# Patient Record
Sex: Female | Born: 1965 | Race: Black or African American | Hispanic: No | Marital: Married | Smoking: Former smoker
Health system: Southern US, Community
[De-identification: ages and names within clinical notes are randomized; demographics above are authoritative.]

## PROBLEM LIST (undated history)

## (undated) DIAGNOSIS — E079 Disorder of thyroid, unspecified: Secondary | ICD-10-CM

## (undated) DIAGNOSIS — B029 Zoster without complications: Secondary | ICD-10-CM

## (undated) DIAGNOSIS — T7840XA Allergy, unspecified, initial encounter: Secondary | ICD-10-CM

## (undated) DIAGNOSIS — E785 Hyperlipidemia, unspecified: Secondary | ICD-10-CM

## (undated) DIAGNOSIS — I10 Essential (primary) hypertension: Secondary | ICD-10-CM

## (undated) DIAGNOSIS — E28319 Asymptomatic premature menopause: Secondary | ICD-10-CM

## (undated) HISTORY — DX: Zoster without complications: B02.9

## (undated) HISTORY — DX: Essential (primary) hypertension: I10

## (undated) HISTORY — DX: Allergy, unspecified, initial encounter: T78.40XA

## (undated) HISTORY — DX: Hyperlipidemia, unspecified: E78.5

## (undated) HISTORY — DX: Asymptomatic premature menopause: E28.319

## (undated) HISTORY — PX: OOPHORECTOMY: SHX86

---

## 2008-12-29 HISTORY — PX: ABDOMINAL HYSTERECTOMY: SHX81

## 2011-04-03 ENCOUNTER — Emergency Department (HOSPITAL_COMMUNITY)
Admission: EM | Admit: 2011-04-03 | Discharge: 2011-04-03 | Disposition: A | Discharge status: Home / Self Care | Payer: No Typology Code available for payment source | Attending: Emergency Medicine | Admitting: Emergency Medicine

## 2011-04-03 DIAGNOSIS — E039 Hypothyroidism, unspecified: Secondary | ICD-10-CM | POA: Insufficient documentation

## 2011-04-03 DIAGNOSIS — M25519 Pain in unspecified shoulder: Secondary | ICD-10-CM | POA: Insufficient documentation

## 2011-04-03 DIAGNOSIS — M542 Cervicalgia: Secondary | ICD-10-CM | POA: Insufficient documentation

## 2011-04-03 DIAGNOSIS — S139XXA Sprain of joints and ligaments of unspecified parts of neck, initial encounter: Secondary | ICD-10-CM | POA: Insufficient documentation

## 2012-04-15 ENCOUNTER — Encounter: Payer: Self-pay | Admitting: Internal Medicine

## 2012-04-15 ENCOUNTER — Ambulatory Visit (INDEPENDENT_AMBULATORY_CARE_PROVIDER_SITE_OTHER): Payer: 59 | Admitting: Internal Medicine

## 2012-04-15 DIAGNOSIS — M549 Dorsalgia, unspecified: Secondary | ICD-10-CM | POA: Insufficient documentation

## 2012-04-15 DIAGNOSIS — Z0000 Encounter for general adult medical examination without abnormal findings: Secondary | ICD-10-CM

## 2012-04-15 MED ORDER — HYDROCHLOROTHIAZIDE 25 MG PO TABS: 25.0000 mg | ORAL_TABLET | Freq: Every day | ORAL | Status: DC

## 2012-04-15 MED ORDER — METHOCARBAMOL 500 MG PO TABS: 500.0000 mg | ORAL_TABLET | Freq: Two times a day (BID) | ORAL | Status: AC | PRN

## 2012-04-15 NOTE — Patient Instructions (Signed)
Please get the following labs: FLP, CMP, TSH, CBC---- Dx V70 ----------------------- for back pain, Stretching twice a day Robaxin twice a day, only at bedtime if it makes you sleepy. Local heat. ----------------------- Check the  blood pressure 2 or 3 times a month, be sure it is less than 140/85. If it is consistently higher, let me know

## 2012-04-15 NOTE — Assessment & Plan Note (Signed)
See  review of systems, recommend stretching, warm compresses on a low dose of Robaxin. Continue with over-the-counter ibuprofen
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
## 2012-04-15 NOTE — Assessment & Plan Note (Addendum)
Td ~2008 Reports a colonoscopy in a few years ago, due to bleeding, she was told has hemorrhoids. Last mammogram per patient ~ 1 2013, last PAP ~ 3 years ago Labs, likes to get them at work, see instructions Diet and exercise discussed Refer to gynecology

## 2012-04-16 ENCOUNTER — Encounter: Payer: Self-pay | Admitting: Internal Medicine

## 2012-05-17 ENCOUNTER — Other Ambulatory Visit: Payer: Self-pay | Admitting: Internal Medicine

## 2012-05-17 MED ORDER — LEVOTHYROXINE SODIUM 125 MCG PO TABS: 125.0000 ug | ORAL_TABLET | Freq: Every day | ORAL | Status: DC

## 2012-05-17 NOTE — Telephone Encounter (Signed)
Refilled levothyroxine. Spoke with pharmacy & pt still has refills of hydrochlorithazide on file.

## 2012-05-27 ENCOUNTER — Telehealth: Payer: Self-pay | Admitting: *Deleted

## 2012-05-27 DIAGNOSIS — N951 Menopausal and female climacteric states: Secondary | ICD-10-CM

## 2012-05-27 NOTE — Telephone Encounter (Signed)
Done

## 2012-06-08 ENCOUNTER — Telehealth: Payer: Self-pay | Admitting: Internal Medicine

## 2012-06-08 NOTE — Telephone Encounter (Signed)
Thank you :)

## 2012-06-08 NOTE — Telephone Encounter (Signed)
Caller: Connie Gentry; PCP: Willow Ora; CB#: [phone]; ; ; Call regarding Dizziness;  onset 06/06/12 but has continued to currently.  States BP 113/67; no recent changes in medications.  Per protocol, emergent symptoms denied; advised appt within 24 hours.  Appt sched 0915 06/09/12 with Dr. Beverely Low, as patient wants to have fasting blood work drawn while in office (per follow up Dr. Drue Novel.)

## 2012-06-09 ENCOUNTER — Ambulatory Visit (INDEPENDENT_AMBULATORY_CARE_PROVIDER_SITE_OTHER): Payer: 59 | Admitting: Family Medicine

## 2012-06-09 ENCOUNTER — Encounter: Payer: Self-pay | Admitting: Family Medicine

## 2012-06-09 VITALS — BP 101/70 | HR 67 | Temp 99.1°F | Ht 66.0 in | Wt 220.8 lb

## 2012-06-09 DIAGNOSIS — E039 Hypothyroidism, unspecified: Secondary | ICD-10-CM

## 2012-06-09 DIAGNOSIS — N951 Menopausal and female climacteric states: Secondary | ICD-10-CM

## 2012-06-09 DIAGNOSIS — R42 Dizziness and giddiness: Secondary | ICD-10-CM

## 2012-06-09 DIAGNOSIS — I10 Essential (primary) hypertension: Secondary | ICD-10-CM | POA: Insufficient documentation

## 2012-06-09 DIAGNOSIS — E785 Hyperlipidemia, unspecified: Secondary | ICD-10-CM | POA: Insufficient documentation

## 2012-06-09 LAB — CBC WITH DIFFERENTIAL/PLATELET
Basophils Relative: 0.4 % (ref 0.0–3.0)
Eosinophils Relative: 2.9 % (ref 0.0–5.0)
Lymphocytes Relative: 32.5 % (ref 12.0–46.0)
Monocytes Relative: 6 % (ref 3.0–12.0)
Neutrophils Relative %: 58.2 % (ref 43.0–77.0)
RBC: 5.17 Mil/uL — ABNORMAL HIGH (ref 3.87–5.11)
WBC: 4.3 10*3/uL — ABNORMAL LOW (ref 4.5–10.5)

## 2012-06-09 LAB — LDL CHOLESTEROL, DIRECT: Direct LDL: 137.2 mg/dL

## 2012-06-09 LAB — LIPID PANEL
HDL: 60.8 mg/dL (ref >39.00)
VLDL: 13.2 mg/dL (ref 0.0–40.0)

## 2012-06-09 LAB — BASIC METABOLIC PANEL
BUN: 13 mg/dL (ref 6–23)
Chloride: 105 mEq/L (ref 96–112)
Glucose, Bld: 87 mg/dL (ref 70–99)
Potassium: 3.6 mEq/L (ref 3.5–5.1)
Sodium: 139 mEq/L (ref 135–145)

## 2012-06-09 LAB — FOLLICLE STIMULATING HORMONE: FSH: 35.4 m[IU]/mL

## 2012-06-09 LAB — HEPATIC FUNCTION PANEL
ALT: 14 U/L (ref 0–35)
AST: 17 U/L (ref 0–37)
Albumin: 3.5 g/dL (ref 3.5–5.2)
Alkaline Phosphatase: 68 U/L (ref 39–117)

## 2012-06-09 NOTE — Assessment & Plan Note (Signed)
New.  Suspect this is due to hypotension.  Stop HCTZ.  Check labs to r/o thyroid, anemia, electrolyte imbalance.  Reviewed supportive care and red flags that should prompt return.  Pt expressed understanding and is in agreement w/ plan.

## 2012-06-09 NOTE — Assessment & Plan Note (Signed)
New to provider, chronic for pt.  Check labs to determine if dose is correct or if this is contributing to dizziness.

## 2012-06-09 NOTE — Progress Notes (Signed)
  Subjective:    Patient ID: Connie Gentry, female    DOB: 11-Jun-1966, 46 y.o.   MRN: [id number]  HPI Dizziness- sxs started yesterday.  Not related to position changes but more noticeable when active.  Drinking fair amount of water.  Pt has recently changed diet, improved lifestyle.  BP has been running low- even prior to taking HCTZ in the AM.  Has hx of low thyroid.  On synthroid.  Denies N/V, visual changes.  No CP, SOB.  Denies fatigue.   Review of Systems For ROS see HPI     Objective:   Physical Exam  Vitals reviewed. Constitutional: She is oriented to person, place, and time. She appears well-developed and well-nourished. No distress.  HENT:  Head: Normocephalic and atraumatic.  Eyes: Conjunctivae and EOM are normal. Pupils are equal, round, and reactive to light.  Neck: Normal range of motion. Neck supple. No thyromegaly present.  Cardiovascular: Normal rate, regular rhythm, normal heart sounds and intact distal pulses.   No murmur heard. Pulmonary/Chest: Effort normal and breath sounds normal. No respiratory distress.  Abdominal: Soft. She exhibits no distension. There is no tenderness.  Musculoskeletal: She exhibits no edema.  Lymphadenopathy:    She has no cervical adenopathy.  Neurological: She is alert and oriented to person, place, and time.  Skin: Skin is warm and dry.  Psychiatric: She has a normal mood and affect. Her behavior is normal.          Assessment & Plan:

## 2012-06-09 NOTE — Assessment & Plan Note (Signed)
Due for labs, did not have them drawn at time of CPE.

## 2012-06-09 NOTE — Patient Instructions (Addendum)
Follow up in 1 month to recheck BP We'll notify you of your lab results STOP the HCTZ Continue to drink plenty of fluids Change positions slowly Keep up the good work on healthy diet and regular activity Call with any questions or concerns CONGRATS!

## 2012-06-09 NOTE — Assessment & Plan Note (Signed)
Improved.  Stop HCTZ and monitor.

## 2012-06-10 ENCOUNTER — Encounter: Payer: Self-pay | Admitting: *Deleted

## 2012-07-06 ENCOUNTER — Encounter: Payer: Self-pay | Admitting: Internal Medicine

## 2012-07-06 ENCOUNTER — Ambulatory Visit (INDEPENDENT_AMBULATORY_CARE_PROVIDER_SITE_OTHER): Payer: 59 | Admitting: Internal Medicine

## 2012-07-06 VITALS — BP 132/86 | HR 73 | Temp 98.9°F | Wt 223.0 lb

## 2012-07-06 DIAGNOSIS — L853 Xerosis cutis: Secondary | ICD-10-CM | POA: Insufficient documentation

## 2012-07-06 DIAGNOSIS — I10 Essential (primary) hypertension: Secondary | ICD-10-CM

## 2012-07-06 DIAGNOSIS — F411 Generalized anxiety disorder: Secondary | ICD-10-CM

## 2012-07-06 DIAGNOSIS — F419 Anxiety disorder, unspecified: Secondary | ICD-10-CM | POA: Insufficient documentation

## 2012-07-06 DIAGNOSIS — R238 Other skin changes: Secondary | ICD-10-CM

## 2012-07-06 MED ORDER — TRIAMCINOLONE 0.1 % CREAM:EUCERIN CREAM 1:1: 1.0000 "application " | TOPICAL_CREAM | Freq: Two times a day (BID) | CUTANEOUS | Status: DC | PRN

## 2012-07-06 MED ORDER — ALPRAZOLAM 0.25 MG PO TABS: 0.2500 mg | ORAL_TABLET | Freq: Four times a day (QID) | ORAL | Status: AC | PRN

## 2012-07-06 NOTE — Progress Notes (Signed)
  Subjective:    Patient ID: Connie Gentry, female    DOB: [birth year], 46 y.o.   MRN: [id number]  HPI Here for evaluation of anxiety. Started a new job several months ago, in the last 2 or 3 weeks she has experienced more stress and anxiety, occasionally she panics. She had the same symptoms before, years ago, see assessment and plan  Also, last month was seen here with dizziness felt to be due to to decrease BP, likely from a much better diet habit. HCTZ was discontinued, in the last 2 weeks her BP has been increased gradually because stress and some "stress eating".  Last week at one point her BP was 156/99.   Past medical history Mild hypertension, diagnosed ~2011 Hypothyroidism, history of Graves' disease, status radioactive  iodine 2003. 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 Sleeping okay but not as well as before.     Objective:   Physical Exam  General -- alert, well-developed, and overweight appearing.  Neurologic-- alert & oriented X3 and strength normal in all extremities. Psych-- Cognition and judgment appear intact. Alert and cooperative with normal attention span and concentration.  Slightly anxious and emotional.      Assessment & Plan:

## 2012-07-06 NOTE — Assessment & Plan Note (Addendum)
Dry itchy skin, uses triamcinolone/eucerin cream as needed. Request a refill---> done

## 2012-07-06 NOTE — Patient Instructions (Addendum)
Check the  blood pressure 2 or 3 times a week, be sure it is between 110/60 and 140/85. If it is consistently higher or lower, let me know  

## 2012-07-06 NOTE — Assessment & Plan Note (Addendum)
Patient presents with anxiety and occasionally a panic feeling. Symptoms are most likely job-stress related. In the past she experienced similar symptoms, she tried multiple SSRIs including Celexa, Paxil and Zoloft. She could not function well while taking them. Eventually was treated with a low dose of alprazolam with very good results. We discussed different modalities of treatment including counseling, exercise and medication. Does not like to try SSRIs again, declined clonazepam as she has taken alprazolam with excellent results before. Plan: Low dose of alprazolam, counseling, reassess in 3 months. Counseled today at the office as wel  Today , I spent more than 25  min with the patient, >50% of the time counseling

## 2012-07-06 NOTE — Assessment & Plan Note (Signed)
After her lifestyle improved, BP become low and HCTZ was discontinued last month. Here lately her BP is slightly increasing. Today is 132/86. Plan: Continue with healthier lifestyle, treat anxiety, see instructions. Consider restart medication.

## 2012-11-16 ENCOUNTER — Other Ambulatory Visit: Payer: Self-pay | Admitting: Internal Medicine

## 2012-11-16 DIAGNOSIS — Z1231 Encounter for screening mammogram for malignant neoplasm of breast: Secondary | ICD-10-CM

## 2012-12-27 ENCOUNTER — Ambulatory Visit: Payer: 59

## 2013-03-16 ENCOUNTER — Other Ambulatory Visit: Payer: Self-pay

## 2013-04-13 ENCOUNTER — Ambulatory Visit
Admission: RE | Admit: 2013-04-13 | Discharge: 2013-04-13 | Disposition: A | Discharge status: Home / Self Care | Payer: 59 | Source: Ambulatory Visit

## 2013-04-13 DIAGNOSIS — Z1231 Encounter for screening mammogram for malignant neoplasm of breast: Secondary | ICD-10-CM

## 2013-04-17 ENCOUNTER — Emergency Department (HOSPITAL_COMMUNITY)
Admission: EM | Admit: 2013-04-17 | Discharge: 2013-04-18 | Disposition: A | Discharge status: Home / Self Care | Payer: 59 | Attending: Emergency Medicine | Admitting: Emergency Medicine

## 2013-04-17 ENCOUNTER — Emergency Department (HOSPITAL_COMMUNITY): Payer: 59

## 2013-04-17 DIAGNOSIS — Z87891 Personal history of nicotine dependence: Secondary | ICD-10-CM | POA: Insufficient documentation

## 2013-04-17 DIAGNOSIS — K2990 Gastroduodenitis, unspecified, without bleeding: Secondary | ICD-10-CM | POA: Insufficient documentation

## 2013-04-17 DIAGNOSIS — Z79899 Other long term (current) drug therapy: Secondary | ICD-10-CM | POA: Insufficient documentation

## 2013-04-17 DIAGNOSIS — K2970 Gastritis, unspecified, without bleeding: Secondary | ICD-10-CM | POA: Insufficient documentation

## 2013-04-17 DIAGNOSIS — R110 Nausea: Secondary | ICD-10-CM | POA: Insufficient documentation

## 2013-04-17 DIAGNOSIS — E785 Hyperlipidemia, unspecified: Secondary | ICD-10-CM | POA: Insufficient documentation

## 2013-04-17 DIAGNOSIS — Z90710 Acquired absence of both cervix and uterus: Secondary | ICD-10-CM | POA: Insufficient documentation

## 2013-04-17 DIAGNOSIS — R10816 Epigastric abdominal tenderness: Secondary | ICD-10-CM | POA: Insufficient documentation

## 2013-04-17 DIAGNOSIS — Z8619 Personal history of other infectious and parasitic diseases: Secondary | ICD-10-CM | POA: Insufficient documentation

## 2013-04-17 DIAGNOSIS — I10 Essential (primary) hypertension: Secondary | ICD-10-CM | POA: Insufficient documentation

## 2013-04-17 DIAGNOSIS — K219 Gastro-esophageal reflux disease without esophagitis: Secondary | ICD-10-CM | POA: Insufficient documentation

## 2013-04-17 LAB — CBC
HCT: 36.7 % (ref 36.0–46.0)
Hemoglobin: 12.1 g/dL (ref 12.0–15.0)
MCH: 21.9 pg — ABNORMAL LOW (ref 26.0–34.0)
MCHC: 33 g/dL (ref 30.0–36.0)
MCV: 66.4 fL — ABNORMAL LOW (ref 78.0–100.0)
Platelets: 225 10*3/uL (ref 150–400)
RBC: 5.53 MIL/uL — ABNORMAL HIGH (ref 3.87–5.11)
RDW: 15.6 % — ABNORMAL HIGH (ref 11.5–15.5)
WBC: 6.6 10*3/uL (ref 4.0–10.5)

## 2013-04-17 LAB — BASIC METABOLIC PANEL
BUN: 14 mg/dL (ref 6–23)
Chloride: 102 mEq/L (ref 96–112)
GFR calc Af Amer: 81 mL/min — ABNORMAL LOW (ref >90)
Potassium: 3.4 mEq/L — ABNORMAL LOW (ref 3.5–5.1)

## 2013-04-17 LAB — BASIC METABOLIC PANEL WITH GFR
CO2: 26 meq/L (ref 19–32)
Calcium: 9.6 mg/dL (ref 8.4–10.5)
Creatinine, Ser: 0.95 mg/dL (ref 0.50–1.10)
GFR calc non Af Amer: 70 mL/min — ABNORMAL LOW (ref >90)
Glucose, Bld: 93 mg/dL (ref 70–99)
Sodium: 137 meq/L (ref 135–145)

## 2013-04-17 LAB — POCT I-STAT TROPONIN I: Troponin i, poc: 0 ng/mL (ref 0.00–0.08)

## 2013-04-17 MED ORDER — ASPIRIN 81 MG PO CHEW
324.0000 mg | CHEWABLE_TABLET | Freq: Once | ORAL | Status: AC
  Administered 2013-04-17: 324 mg via ORAL
  Filled 2013-04-17: qty 1
  Filled 2013-04-17: qty 3

## 2013-04-17 MED ORDER — PANTOPRAZOLE SODIUM 40 MG PO TBEC
40.0000 mg | DELAYED_RELEASE_TABLET | Freq: Once | ORAL | Status: AC
  Administered 2013-04-17: 40 mg via ORAL
  Filled 2013-04-17: qty 1

## 2013-04-17 MED ORDER — GI COCKTAIL ~~LOC~~
30.0000 mL | Freq: Once | ORAL | Status: AC
  Administered 2013-04-17: 30 mL via ORAL
  Filled 2013-04-17: qty 30

## 2013-04-17 NOTE — ED Notes (Addendum)
Pt to the ED with c/o chest pain. Pt states tonight she had a sudden onset of sharp sternal chest pain, radiating to back. Pt denies shortness of breath, n/v. Pt took one 325 asa and a Rolaid x1. Pt received some relief, now states pain 1/10. Pt denies hx of the same. Pt is A&Ox4, respirations are equal and unlabored, skin warm and dry, husband at bedside

## 2013-04-17 NOTE — ED Provider Notes (Signed)
History     CSN: [id number]  Arrival date & time 04/17/13  2155   First MD Initiated Contact with Patient 04/17/13 2243      Chief Complaint  Patient presents with  . Chest Pain    (Consider location/radiation/quality/duration/timing/severity/associated sxs/prior treatment) HPI History provided by pt.   Pt had acute onset constant, sharp, non-radiating, mid-sternal CP at approx 9:30pm while sitting down.  Has mild discomfort currently but severe pain began to improve after 20-30 minutes.  No immediate relief w/ aspirin or rolaids.  No associated symptoms, w/ exception of intermittent nausea over past few days that seem to coincide w/ her menopausal hot flashes. Has never had pain like this before.  No recent heavy lifting or trauma to chest.  No PMH, does not smoke cigarettes and no immediate FH of MI.   Past Medical History  Diagnosis Date  . Chicken pox   . Allergy   . Hypertension   . Hyperlipidemia     Past Surgical History  Procedure Laterality Date  . Abdominal hysterectomy  2010    partial    Family History  Problem Relation Age of Onset  . Hypertension Mother   . Hypertension Father   . Arthritis Maternal Grandmother   . Heart disease Maternal Grandmother   . Arthritis Maternal Grandfather   . Heart disease Maternal Grandfather   . Arthritis Paternal Grandmother   . Heart disease Paternal Grandmother   . Arthritis Paternal Grandfather   . Heart disease Paternal Grandfather   . Diabetes Other     History  Substance Use Topics  . Smoking status: Former Games developer  . Smokeless tobacco: Never Used  . Alcohol Use:     OB History   Grav Para Term Preterm Abortions TAB SAB Ect Mult Living                  Review of Systems  All other systems reviewed and are negative.    Allergies  Cephalexin and Vicodin  Home Medications   Current Outpatient Rx  Name  Route  Sig  Dispense  Refill  . cholecalciferol (VITAMIN D) 1000 UNITS tablet   Oral   Take  1,000 Units by mouth daily.         . hydrochlorothiazide (MICROZIDE) 12.5 MG capsule   Oral   Take 12.5 mg by mouth daily.         Marland Kitchen levothyroxine (SYNTHROID, LEVOTHROID) 125 MCG tablet   Oral   Take 1 tablet (125 mcg total) by mouth daily.   90 tablet   3   . methocarbamol (ROBAXIN) 500 MG tablet   Oral   Take 500 mg by mouth at bedtime as needed and may repeat dose one time if needed. Back spasms         . Triamcinolone Acetonide (TRIAMCINOLONE 0.1 % CREAM : EUCERIN) CREA   Topical   Apply 1 application topically 2 (two) times daily as needed.   1 each   3   . vitamin E 400 UNIT capsule   Oral   Take 400 Units by mouth daily.           BP 137/84  Pulse 69  Temp(Src) 97.9 F (36.6 C) (Oral)  Resp 18  Ht 5\' 6"  (1.676 m)  Wt 216 lb (97.977 kg)  BMI 34.88 kg/m2  SpO2 100%  LMP 07/29/2012  Physical Exam  Nursing note and vitals reviewed. Constitutional: She is oriented to person, place, and time. She appears  well-developed and well-nourished. No distress.  HENT:  Head: Normocephalic and atraumatic.  Eyes:  Normal appearance  Neck: Normal range of motion.  Cardiovascular: Normal rate, regular rhythm and intact distal pulses.   Pulmonary/Chest: Effort normal and breath sounds normal. No respiratory distress. She exhibits no tenderness.  No pleuritic pain reported  Abdominal: Soft. Bowel sounds are normal. She exhibits no distension. There is no guarding.  Mid epigastric tenderness  Musculoskeletal: Normal range of motion.  No peripheral edema or calf tenderness  Neurological: She is alert and oriented to person, place, and time.  Skin: Skin is warm and dry. No rash noted.  Psychiatric: She has a normal mood and affect. Her behavior is normal.    ED Course  Procedures (including critical care time)   Date: 04/18/2013  Rate: 58  Rhythm: sinus bradycardia  QRS Axis: normal  Intervals: normal  ST/T Wave abnormalities: normal  Conduction  Disutrbances:none  Narrative Interpretation:   Old EKG Reviewed: none available   Labs Reviewed  CBC - Abnormal; Notable for the following:    RBC 5.53 (*)    MCV 66.4 (*)    MCH 21.9 (*)    RDW 15.6 (*)    All other components within normal limits  BASIC METABOLIC PANEL   Dg Chest 2 View  04/17/2013  *RADIOLOGY REPORT*  Clinical Data: Mid chest pain.  Nonsmoker.  CHEST - 2 VIEW  Comparison: None.  Findings: Midline trachea.  Mild convex right thoracic spine curvature. Normal heart size and mediastinal contours. No pleural effusion or pneumothorax.  Clear lungs.  IMPRESSION: No acute cardiopulmonary disease.   Original Report Authenticated By: Jeronimo Greaves, M.D.      1. Gastritis   2. Acid reflux       MDM  Healthy 47yo F presents w/ acute onset substernal CP at rest this evening.  Doubt ACS; pain atypical and more consistent w/ indigestion, epigastric tenderness on exam and relief w/ GI cocktail, TIMI 0, and EKG non-ischemic.  Recommended prilosec x 2 weeks, prescribed zofran for intermittent nausea and referred back to PCP.  Return precautions discussed.        Otilio Miu, PA-C 04/18/13 [phone]

## 2013-04-18 ENCOUNTER — Encounter: Payer: 59 | Admitting: Internal Medicine

## 2013-04-18 ENCOUNTER — Encounter: Payer: Self-pay | Admitting: *Deleted

## 2013-04-18 DIAGNOSIS — Z0289 Encounter for other administrative examinations: Secondary | ICD-10-CM

## 2013-04-18 MED ORDER — ONDANSETRON HCL 8 MG PO TABS: 8.0000 mg | ORAL_TABLET | Freq: Three times a day (TID) | ORAL | Status: DC | PRN

## 2013-04-18 NOTE — ED Provider Notes (Signed)
Medical screening examination/treatment/procedure(s) were performed by non-physician practitioner and as supervising physician I was immediately available for consultation/collaboration.   Gavin Pound. Oletta Lamas, MD 04/18/13 [phone]

## 2013-04-20 ENCOUNTER — Encounter: Payer: Self-pay | Admitting: Family Medicine

## 2013-04-20 ENCOUNTER — Ambulatory Visit (INDEPENDENT_AMBULATORY_CARE_PROVIDER_SITE_OTHER): Payer: 59 | Admitting: Family Medicine

## 2013-04-20 ENCOUNTER — Ambulatory Visit: Payer: 59 | Admitting: Internal Medicine

## 2013-04-20 VITALS — BP 116/80 | HR 80 | Temp 99.2°F | Wt 219.2 lb

## 2013-04-20 DIAGNOSIS — K219 Gastro-esophageal reflux disease without esophagitis: Secondary | ICD-10-CM

## 2013-04-20 LAB — BASIC METABOLIC PANEL
GFR: 88.08 mL/min (ref >60.00)
Potassium: 3.2 mEq/L — ABNORMAL LOW (ref 3.5–5.1)
Sodium: 140 mEq/L (ref 135–145)

## 2013-04-20 LAB — CBC WITH DIFFERENTIAL/PLATELET
Eosinophils Relative: 4.2 % (ref 0.0–5.0)
HCT: 36.8 % (ref 36.0–46.0)
Hemoglobin: 11.7 g/dL — ABNORMAL LOW (ref 12.0–15.0)
Lymphs Abs: 1.7 10*3/uL (ref 0.7–4.0)
Monocytes Relative: 5.6 % (ref 3.0–12.0)
Neutro Abs: 3.2 10*3/uL (ref 1.4–7.7)
RBC: 5.26 Mil/uL — ABNORMAL HIGH (ref 3.87–5.11)
WBC: 5.4 10*3/uL (ref 4.5–10.5)

## 2013-04-20 LAB — HEPATIC FUNCTION PANEL
ALT: 20 U/L (ref 0–35)
Alkaline Phosphatase: 89 U/L (ref 39–117)
Bilirubin, Direct: 0 mg/dL (ref 0.0–0.3)
Total Protein: 7 g/dL (ref 6.0–8.3)

## 2013-04-20 LAB — POCT URINALYSIS DIPSTICK
Blood, UA: NEGATIVE
Glucose, UA: NEGATIVE
Ketones, UA: NEGATIVE
Spec Grav, UA: 1.01
Urobilinogen, UA: 0.2

## 2013-04-20 MED ORDER — DEXLANSOPRAZOLE 60 MG PO CPDR: 60.0000 mg | DELAYED_RELEASE_CAPSULE | Freq: Every day | ORAL | Status: DC

## 2013-04-20 NOTE — Patient Instructions (Signed)
Diet for Gastroesophageal Reflux Disease, Adult  Reflux (acid reflux) is when acid from your stomach flows up into the esophagus. When acid comes in contact with the esophagus, the acid causes irritation and soreness (inflammation) in the esophagus. When reflux happens often or so severely that it causes damage to the esophagus, it is called gastroesophageal reflux disease (GERD). Nutrition therapy can help ease the discomfort of GERD.  FOODS OR DRINKS TO AVOID OR LIMIT   Smoking or chewing tobacco. Nicotine is one of the most potent stimulants to acid production in the gastrointestinal tract.   Caffeinated and decaffeinated coffee and black tea.   Regular or low-calorie carbonated beverages or energy drinks (caffeine-free carbonated beverages are allowed).    Strong spices, such as black pepper, white pepper, red pepper, cayenne, curry powder, and chili powder.   Peppermint or spearmint.   Chocolate.   High-fat foods, including meats and fried foods. Extra added fats including oils, butter, salad dressings, and nuts. Limit these to less than 8 tsp per day.   Fruits and vegetables if they are not tolerated, such as citrus fruits or tomatoes.   Alcohol.   Any food that seems to aggravate your condition.  If you have questions regarding your diet, call your caregiver or a registered dietitian.  OTHER THINGS THAT MAY HELP GERD INCLUDE:    Eating your meals slowly, in a relaxed setting.   Eating 5 to 6 small meals per day instead of 3 large meals.   Eliminating food for a period of time if it causes distress.   Not lying down until 3 hours after eating a meal.   Keeping the head of your bed raised 6 to 9 inches (15 to 23 cm) by using a foam wedge or blocks under the legs of the bed. Lying flat may make symptoms worse.   Being physically active. Weight loss may be helpful in reducing reflux in overweight or obese adults.   Wear loose fitting clothing  EXAMPLE MEAL PLAN  This meal plan is approximately  2,000 calories based on ChooseMyPlate.gov meal planning guidelines.  Breakfast    cup cooked oatmeal.   1 cup strawberries.   1 cup low-fat milk.   1 oz almonds.  Snack   1 cup cucumber slices.   6 oz yogurt (made from low-fat or fat-free milk).  Lunch   2 slice whole-wheat bread.   2 oz sliced turkey.   2 tsp mayonnaise.   1 cup blueberries.   1 cup snap peas.  Snack   6 whole-wheat crackers.   1 oz string cheese.  Dinner    cup brown rice.   1 cup mixed veggies.   1 tsp olive oil.   3 oz grilled fish.  Document Released: 12/15/2005 Document Revised: 03/08/2012 Document Reviewed: 10/31/2011  [employer] Patient Information 2013 [employer].

## 2013-04-20 NOTE — Progress Notes (Signed)
  Subjective:     Connie Gentry is an 47 y.o. female who presents for evaluation of heartburn. This has been associated with belching, chest pain, heartburn and midespigastric pain. She denies abdominal bloating, belching and eructation, bilious reflux, choking on food, difficulty swallowing, dysphagia, fullness after meals, hematemesis, laryngitis, need to clear throat frequently, nocturnal burning, odynophagia, regurgitation of undigested food, shortness of breath, symptoms primarily relate to meals, and lying down after meals, unexpected weight loss, upper abdominal discomfort and waterbrash. Symptoms have been present for several days. She denies dysphagia. She has not lost weight. She denies melena, hematochezia, hematemesis, and coffee ground emesis. Medical therapy in the past has included: antacids and proton pump inhibitors.  The following portions of the patient's history were reviewed and updated as appropriate: allergies, current medications, past family history, past medical history, past social history, past surgical history and problem list.  Review of Systems Pertinent items are noted in HPI.   Objective:     BP 116/80  Pulse 80  Temp(Src) 99.2 F (37.3 C) (Oral)  Wt 219 lb 3.2 oz (99.428 kg)  BMI 35.4 kg/m2  SpO2 98%  LMP 07/29/2012 General appearance: alert, cooperative, appears stated age and no distress Throat: lips, mucosa, and tongue normal; teeth and gums normal Lungs: clear to auscultation bilaterally Heart: regular rate and rhythm, S1, S2 normal, no murmur, click, rub or gallop Abdomen: soft, non-tender; bowel sounds normal; no masses,  no organomegaly   Assessment:    Gastroesophageal Reflux Disease,      Plan:    Nonpharmacologic treatments were discussed including: eating smaller meals, elevation of the head of bed at night, avoidance of caffeine, chocolate, nicotine and peppermint, and avoiding tight fitting clothing. Will start a trial of  dexilant. "Red Flag" symptoms of chest pain ( see ED visit) are present. Will refer to GI for further evaluation.

## 2013-04-21 ENCOUNTER — Encounter: Payer: Self-pay | Admitting: Gastroenterology

## 2013-04-22 ENCOUNTER — Ambulatory Visit: Payer: 59 | Admitting: Internal Medicine

## 2013-04-28 ENCOUNTER — Encounter: Payer: Self-pay | Admitting: Internal Medicine

## 2013-04-29 ENCOUNTER — Other Ambulatory Visit: Payer: Self-pay

## 2013-04-29 MED ORDER — POTASSIUM CHLORIDE CRYS ER 20 MEQ PO TBCR: 20.0000 meq | EXTENDED_RELEASE_TABLET | Freq: Every day | ORAL | Status: DC

## 2013-05-11 ENCOUNTER — Encounter: Payer: 59 | Admitting: Internal Medicine

## 2013-05-11 DIAGNOSIS — Z0289 Encounter for other administrative examinations: Secondary | ICD-10-CM

## 2013-05-16 ENCOUNTER — Ambulatory Visit: Payer: 59 | Admitting: Gastroenterology

## 2013-05-17 ENCOUNTER — Telehealth: Payer: Self-pay | Admitting: Gastroenterology

## 2013-05-17 NOTE — Telephone Encounter (Signed)
Message copied by Leanor Kail I on Tue May 17, 2013  8:20 AM ------      Message from: Donata Duff      Created: Mon May 16, 2013  9:43 AM       Do not bill ------

## 2013-05-18 ENCOUNTER — Telehealth: Payer: Self-pay | Admitting: Internal Medicine

## 2013-05-19 NOTE — Telephone Encounter (Signed)
Ok to refill 

## 2013-05-19 NOTE — Telephone Encounter (Signed)
What does she needs RF?

## 2013-06-24 ENCOUNTER — Other Ambulatory Visit: Payer: Self-pay | Admitting: Internal Medicine

## 2013-06-24 MED ORDER — LEVOTHYROXINE SODIUM 125 MCG PO TABS: 125.0000 ug | ORAL_TABLET | Freq: Every day | ORAL | Status: DC

## 2013-06-24 MED ORDER — HYDROCHLOROTHIAZIDE 25 MG PO TABS: ORAL_TABLET | ORAL | Status: DC

## 2013-06-24 NOTE — Telephone Encounter (Signed)
Refill done.  

## 2013-07-08 ENCOUNTER — Ambulatory Visit (INDEPENDENT_AMBULATORY_CARE_PROVIDER_SITE_OTHER): Payer: 59 | Admitting: Internal Medicine

## 2013-07-08 VITALS — BP 95/68 | HR 78 | Temp 97.8°F | Ht 66.0 in | Wt 218.4 lb

## 2013-07-08 DIAGNOSIS — F419 Anxiety disorder, unspecified: Secondary | ICD-10-CM

## 2013-07-08 DIAGNOSIS — E039 Hypothyroidism, unspecified: Secondary | ICD-10-CM

## 2013-07-08 DIAGNOSIS — I10 Essential (primary) hypertension: Secondary | ICD-10-CM

## 2013-07-08 DIAGNOSIS — Z0000 Encounter for general adult medical examination without abnormal findings: Secondary | ICD-10-CM

## 2013-07-08 DIAGNOSIS — L853 Xerosis cutis: Secondary | ICD-10-CM

## 2013-07-08 DIAGNOSIS — M549 Dorsalgia, unspecified: Secondary | ICD-10-CM

## 2013-07-08 NOTE — Assessment & Plan Note (Addendum)
Will call for a triamcinolone refill as needed

## 2013-07-08 NOTE — Assessment & Plan Note (Signed)
Her stress level has decrease, she is doing much better with diet and exercise. Self decrease  hydrochlorothiazide to half tablet daily, BP today in the low side. Plan: DC hydrochlorothiazide Discontinue potassium Keep an eye on the ambulatory BPs Follow up in 6 months.

## 2013-07-08 NOTE — Progress Notes (Signed)
  Subjective:    Patient ID: Connie Gentry, female    DOB: 1966-06-29, 47 y.o.   MRN: [id number]  HPI CPX   Past medical history Mild hypertension, diagnosed ~2011 Hypothyroidism, history of Graves' disease, status radioactive  iodine 2003. 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 Denies chest pain or shortness of breath Denies nausea, vomiting, diarrhea. From time to time she sees drops of red blood per rectum after a bowel movement usually associated with straining. Occasional back pain on Robaxin when necessary. Denies any dysuria, vaginal bleeding or vaginal discharge. GERD symptoms are well controlled, denies dysphagia, odynophagia.    Objective:   Physical Exam BP 95/68  Pulse 78  Temp(Src) 97.8 F (36.6 C) (Oral)  Ht 5\' 6"  (1.676 m)  Wt 218 lb 6.4 oz (99.066 kg)  BMI 35.27 kg/m2  SpO2 97%  General -- alert, well-developed,NAD  Neck --no thyromegaly Lungs -- normal respiratory effort, no intercostal retractions, no accessory muscle use, and normal breath sounds.   Heart-- normal rate, regular rhythm, no murmur, and no gallop.   Abdomen--soft, non-tender, no distention, no masses, no HSM, no guarding, and no rigidity.   Extremities-- no pretibial edema  bilaterally Neurologic-- alert & oriented X3 and strength normal in all extremities. Psych-- Cognition and judgment appear intact. Alert and cooperative with normal attention span and concentration.  not anxious appearing and not depressed appearing.       Assessment & Plan:

## 2013-07-08 NOTE — Assessment & Plan Note (Signed)
Occasionally uses Robaxin

## 2013-07-08 NOTE — Patient Instructions (Addendum)
Please get the following fasting labs: CMP, FLP, CBC, TSH --- dx V70 ---- Check the  blood pressure 2 or 3 times a week, be sure it is between 110/60 and 140/85. If it is consistently higher or lower, let me know --- Next visit in 6 months --- Call [facility]  to see when are you due for a check up

## 2013-07-08 NOTE — Assessment & Plan Note (Signed)
Td ~2008 Reports a colonoscopy in her 50s, due to bleeding, she was told has hemorrhoids. We agreed to repeat a Cscope at age 47 Last mammogram per patient 03-2013 Saw gyn 05-2012, had a PAP, next PAP per gyn Labs, likes to get them at work, see instructions Diet and exercise discussed

## 2013-07-08 NOTE — Assessment & Plan Note (Signed)
Good med compliance , labs  

## 2013-07-08 NOTE — Assessment & Plan Note (Signed)
Much improved, stress at work has decreased significantly

## 2013-07-19 ENCOUNTER — Ambulatory Visit (HOSPITAL_COMMUNITY)
Admission: RE | Admit: 2013-07-19 | Discharge: 2013-07-19 | Disposition: A | Discharge status: Home / Self Care | Payer: 59 | Source: Ambulatory Visit | Attending: Family Medicine | Admitting: Family Medicine

## 2013-07-19 ENCOUNTER — Telehealth: Payer: Self-pay | Admitting: Internal Medicine

## 2013-07-19 ENCOUNTER — Ambulatory Visit (INDEPENDENT_AMBULATORY_CARE_PROVIDER_SITE_OTHER): Payer: 59 | Admitting: Family Medicine

## 2013-07-19 ENCOUNTER — Encounter: Payer: Self-pay | Admitting: Family Medicine

## 2013-07-19 VITALS — BP 120/80 | HR 65 | Temp 97.8°F | Ht 66.75 in | Wt 221.8 lb

## 2013-07-19 DIAGNOSIS — M7742 Metatarsalgia, left foot: Secondary | ICD-10-CM

## 2013-07-19 DIAGNOSIS — R609 Edema, unspecified: Secondary | ICD-10-CM | POA: Insufficient documentation

## 2013-07-19 DIAGNOSIS — M79609 Pain in unspecified limb: Secondary | ICD-10-CM | POA: Insufficient documentation

## 2013-07-19 DIAGNOSIS — M7750 Other enthesopathy of unspecified foot: Secondary | ICD-10-CM

## 2013-07-19 MED ORDER — NAPROXEN 500 MG PO TABS: 500.0000 mg | ORAL_TABLET | Freq: Two times a day (BID) | ORAL | Status: DC

## 2013-07-19 NOTE — Telephone Encounter (Signed)
Patient Information:  Caller Name: Jodene  Phone: [phone]  Patient: Connie Gentry, Connie Gentry  Gender: Female  DOB: Nov 21, 1966  Age: 47 Years  PCP: Willow Ora  Pregnant: No  Office Follow Up:  Does the office need to follow up with this patient?: No  Instructions For The Office: N/A  RN Note:  Menopausal. Moderate bilateral foot pain worsens when walks or flexes foot but aching pain present all the time.  Denies injury. Roller skates for 2.5 hours once a week. Wears flip flops a lot at home when not working. Unable to come for PM appointment with Dr Drue Novel so scheduled morning appointment with Dr Beverely Low.    Symptoms  Reason For Call & Symptoms: "Terrible" bilateral foot pain with edema and venous distension.  Pain and edema  worse in left foot.  Also having similiar pain without swelling in left hand.  Reviewed Health History In EMR: Yes  Reviewed Medications In EMR: Yes  Reviewed Allergies In EMR: Yes  Reviewed Surgeries / Procedures: No  Date of Onset of Symptoms: 07/17/2013  Treatments Tried: elevation, ibuprofen, soaks in hot water, added orthotic to shoe  Treatments Tried Worked: No OB / GYN:  LMP: Unknown  Guideline(s) Used:  Foot Pain  Disposition Per Guideline:   See Today in Office  Reason For Disposition Reached:   Swollen foot (EXCEPTIONs: localized bump from bunions, calluses, insect bite, sting)  Advice Given:  Reassurance - Foot Pain  : The symptoms you describe do not sound serious. You have told me that there is no redness, swelling, or fever. You have also told me that there has been no recent major injury.  Reassurance - Overuse  Foot pain and soreness are very common following vigorous activity. Such activities include sports like tennis and basketball, jogging, and certain types of work (lots of walking).  Aggravating Factors   Aging: Foot pain is more common in the elderly. During the aging process, the feet widen and flatten, and the skin becomes  drier.  Obesity: Foot pain is also more common in overweight individuals. Being overweight puts excess stress on the bones and soft tissues of the feet.  Overuse: People that are in professions that require prolonged standing and walking commonly report foot pain.  Shoes: Poorly fitting or overly tight shoes are the underlying cause of many painful foot conditions. High heels are bad for feet.  Pain Medicines:  For pain relief, you can take either acetaminophen, ibuprofen, or naproxen.  Call Back If:  Swelling, redness, or fever occur  Severe pain not relieved by pain medication  Pain lasts over 7 days  You become worse.  Patient Will Follow Care Advice:  YES  Appointment Scheduled:  07/19/2013 11:30:00 Appointment Scheduled Provider:  Sheliah Hatch.

## 2013-07-19 NOTE — Assessment & Plan Note (Signed)
New.  + TTP over 4th and 5th metatarsals.  Get xray to assess for possible stress fx due to recent increase in activity.  Start scheduled NSAIDs.  If no fx but no improvement in next week, will refer to sports med.  Pt expressed understanding and is in agreement w/ plan.

## 2013-07-19 NOTE — Patient Instructions (Addendum)
Go to [facility] and get your [employer] the Naproxen twice daily- take w/ food ICE, elevate and good supportive footwear We'll call you with your xray results If no better by late this week or early next- call me and we'll set you up w/ sports med [place] in there!!!

## 2013-07-19 NOTE — Telephone Encounter (Signed)
FYI. Pt. Has scheduled appt with Dr. Beverely Low at [phone] today

## 2013-07-19 NOTE — Progress Notes (Signed)
  Subjective:    Patient ID: Connie Gentry, female    DOB: [birth year], 47 y.o.   MRN: [id number]  HPI Foot pain- L foot, pain started Sunday.  Pain is on top of foot.  Some swelling.  No known injury.  Did not increase or change level of activity recently but did start roller skating every Sunday 3-4 months ago.  Did jump rope on Saturday- was wearing sneakers.  Pain is positional.  Will throb at rest.  Has tried ibuprofen w/ temporary relief.  Has been wearing a lot of flip-flops.   Review of Systems For ROS see HPI     Objective:   Physical Exam  Vitals reviewed. Constitutional: She appears well-developed and well-nourished. No distress.  Cardiovascular: Intact distal pulses.   Musculoskeletal:       Left foot: She exhibits tenderness (over 4th and 5th metatarsals), bony tenderness and swelling (dorsal edema at base of metatarsals). She exhibits normal range of motion and no deformity.          Assessment & Plan:

## 2013-08-01 ENCOUNTER — Other Ambulatory Visit: Payer: Self-pay | Admitting: Internal Medicine

## 2013-08-01 LAB — LIPID PANEL
Cholesterol: 227 mg/dL — ABNORMAL HIGH (ref 0–200)
HDL: 56 mg/dL (ref >39)
Total CHOL/HDL Ratio: 4.1 Ratio
Triglycerides: 83 mg/dL (ref <150)
VLDL: 17 mg/dL (ref 0–40)

## 2013-08-01 LAB — COMPREHENSIVE METABOLIC PANEL
ALT: 15 U/L (ref 0–35)
BUN: 11 mg/dL (ref 6–23)
CO2: 31 mEq/L (ref 19–32)
Calcium: 9.2 mg/dL (ref 8.4–10.5)
Creat: 0.85 mg/dL (ref 0.50–1.10)
Glucose, Bld: 95 mg/dL (ref 70–99)
Total Bilirubin: 0.4 mg/dL (ref 0.3–1.2)

## 2013-08-02 LAB — CBC
HCT: 35.3 % — ABNORMAL LOW (ref 36.0–46.0)
MCH: 21.1 pg — ABNORMAL LOW (ref 26.0–34.0)
MCV: 66.6 fL — ABNORMAL LOW (ref 78.0–100.0)
Platelets: 243 10*3/uL (ref 150–400)
RBC: 5.3 MIL/uL — ABNORMAL HIGH (ref 3.87–5.11)

## 2013-08-19 ENCOUNTER — Encounter: Payer: Self-pay | Admitting: *Deleted

## 2013-08-19 ENCOUNTER — Telehealth: Payer: Self-pay | Admitting: *Deleted

## 2013-08-19 DIAGNOSIS — D649 Anemia, unspecified: Secondary | ICD-10-CM

## 2013-08-19 NOTE — Telephone Encounter (Signed)
Message copied by Shirlee More I on Fri Aug 19, 2013  3:00 PM ------      Message from: Willow Ora E      Created: Thu Aug 18, 2013  8:42 AM       Please call patient:      Cholesterol is borderline elevated, Recommend a healthy diet and recheck yearly.      She continue with mild anemia, please arrange for:      Iron, ferritin, B12, folic acid --- DX anemia.      If she has iron deficiency, she will need to see GI.             ------

## 2013-08-19 NOTE — Telephone Encounter (Signed)
Discussed with patient, verbalized understanding. Orders placed for future order to be drawn at [facility] per pt. Request.

## 2013-09-22 ENCOUNTER — Telehealth: Payer: Self-pay | Admitting: Internal Medicine

## 2013-09-22 NOTE — Telephone Encounter (Signed)
Several weeks ago I requested labs:  Iron, ferritin, B12, folic acid --- DX anemia.  Please call pt , did she ever do labs? Results? If not please arrange

## 2013-09-26 NOTE — Telephone Encounter (Signed)
Spoke with pt who states that labs will be drawn tomorrow, she apologizes for the delay.

## 2013-09-27 ENCOUNTER — Other Ambulatory Visit: Payer: 59

## 2013-09-27 DIAGNOSIS — D649 Anemia, unspecified: Secondary | ICD-10-CM

## 2013-09-27 LAB — IRON: Iron: 71 ug/dL (ref 42–145)

## 2013-09-27 LAB — FOLATE: Folate: 14 ng/mL

## 2013-09-27 LAB — VITAMIN B12: Vitamin B-12: 368 pg/mL (ref 211–911)

## 2013-09-27 LAB — FERRITIN: Ferritin: 46 ng/mL (ref 10–291)

## 2013-10-12 ENCOUNTER — Other Ambulatory Visit: Payer: Self-pay | Admitting: Internal Medicine

## 2013-10-12 NOTE — Telephone Encounter (Signed)
Levothyroxine and robaxin refills sent to pharmacy

## 2013-11-03 ENCOUNTER — Other Ambulatory Visit: Payer: Self-pay

## 2013-11-28 DIAGNOSIS — B029 Zoster without complications: Secondary | ICD-10-CM

## 2013-11-28 HISTORY — DX: Zoster without complications: B02.9

## 2013-12-02 ENCOUNTER — Encounter (HOSPITAL_COMMUNITY): Payer: Self-pay | Admitting: Emergency Medicine

## 2013-12-02 ENCOUNTER — Emergency Department (HOSPITAL_COMMUNITY)
Admission: EM | Admit: 2013-12-02 | Discharge: 2013-12-02 | Disposition: A | Discharge status: Home / Self Care | Payer: 59 | Source: Home / Self Care | Attending: Family Medicine | Admitting: Family Medicine

## 2013-12-02 DIAGNOSIS — J329 Chronic sinusitis, unspecified: Secondary | ICD-10-CM

## 2013-12-02 HISTORY — DX: Disorder of thyroid, unspecified: E07.9

## 2013-12-02 MED ORDER — AMOXICILLIN-POT CLAVULANATE 875-125 MG PO TABS: 1.0000 | ORAL_TABLET | Freq: Two times a day (BID) | ORAL | Status: DC

## 2013-12-02 NOTE — ED Provider Notes (Signed)
Medical screening examination/treatment/procedure(s) were performed by non-physician practitioner and as supervising physician I was immediately available for consultation/collaboration.  Azucena Dart, M.D.  Suriyah Vergara C Blanka Rockholt, MD 12/02/13 2223 

## 2013-12-02 NOTE — ED Notes (Signed)
Sinus drainage, with cough and facial pain.  Non-productive cough, secretions blown from nose is clear, denies fever

## 2013-12-02 NOTE — ED Provider Notes (Signed)
CSN: [id number]     Arrival date & time 12/02/13  1749 History   First MD Initiated Contact with Patient 12/02/13 1859     Chief Complaint  Patient presents with  . Facial Pain   (Consider location/radiation/quality/duration/timing/severity/associated sxs/prior Treatment) Patient is a 47 y.o. female presenting with URI. The history is provided by the patient. No language interpreter was used.  URI Presenting symptoms: congestion and facial pain   Severity:  Moderate Onset quality:  Gradual Progression:  Worsening Chronicity:  New Relieved by:  Nothing Worsened by:  Nothing tried Ineffective treatments:  None tried Risk factors: sick contacts     Past Medical History  Diagnosis Date  . Chicken pox   . Allergy   . Hypertension   . Hyperlipidemia   . Thyroid disease    Past Surgical History  Procedure Laterality Date  . Abdominal hysterectomy  2010    partial   Family History  Problem Relation Age of Onset  . Hypertension Mother   . Hypertension Father   . Arthritis Maternal Grandmother   . Heart disease Maternal Grandmother   . Arthritis Maternal Grandfather   . Heart disease Maternal Grandfather   . Arthritis Paternal Grandmother   . Heart disease Paternal Grandmother   . Arthritis Paternal Grandfather   . Heart disease Paternal Grandfather   . Diabetes Other    History  Substance Use Topics  . Smoking status: Former Games developer  . Smokeless tobacco: Never Used  . Alcohol Use: Not on file   OB History   Grav Para Term Preterm Abortions TAB SAB Ect Mult Living                 Review of Systems  HENT: Positive for congestion.   All other systems reviewed and are negative.    Allergies  Cephalexin and Vicodin  Home Medications   Current Outpatient Rx  Name  Route  Sig  Dispense  Refill  . hydrochlorothiazide (HYDRODIURIL) 12.5 MG tablet   Oral   Take 12.5 mg by mouth daily.         . cholecalciferol (VITAMIN D) 1000 UNITS tablet   Oral   Take  1,000 Units by mouth daily.         Marland Kitchen dexlansoprazole (DEXILANT) 60 MG capsule   Oral   Take 1 capsule (60 mg total) by mouth daily.   90 capsule   3   . levothyroxine (SYNTHROID, LEVOTHROID) 125 MCG tablet      TAKE 1 TABLET BY MOUTH ONCE DAILY   90 tablet   1   . methocarbamol (ROBAXIN) 500 MG tablet      TAKE 1 TABLET BY MOUTH TWICE DAILY   60 tablet   0   . naproxen (NAPROSYN) 500 MG tablet   Oral   Take 1 tablet (500 mg total) by mouth 2 (two) times daily with a meal.   60 tablet   0   . Triamcinolone Acetonide (TRIAMCINOLONE 0.1 % CREAM : EUCERIN) CREA   Topical   Apply 1 application topically 2 (two) times daily as needed.   1 each   3   . vitamin E 400 UNIT capsule   Oral   Take 400 Units by mouth daily.          BP 120/87  Pulse 69  Temp(Src) 98.4 F (36.9 C) (Oral)  Resp 16  SpO2 100%  LMP 08/10/2013 Physical Exam  Nursing note and vitals reviewed. Constitutional: She is oriented  to person, place, and time. She appears well-developed and well-nourished.  HENT:  Head: Normocephalic.  Right Ear: External ear normal.  Left Ear: External ear normal.  Nose: Nose normal.  Mouth/Throat: Oropharynx is clear and moist.  Tender maxillary sinuses bilat  Eyes: EOM are normal.  Neck: Normal range of motion.  Cardiovascular: Normal rate and normal heart sounds.   Pulmonary/Chest: Effort normal.  Abdominal: She exhibits no distension.  Musculoskeletal: Normal range of motion.  Neurological: She is alert and oriented to person, place, and time.  Psychiatric: She has a normal mood and affect.    ED Course  Procedures (including critical care time) Labs Review Labs Reviewed - No data to display Imaging Review No results found.  EKG Interpretation    Date/Time:    Ventricular Rate:    PR Interval:    QRS Duration:   QT Interval:    QTC Calculation:   R Axis:     Text Interpretation:              MDM   1. Sinusitis    Augmentin      Elson Areas, PA-C 12/02/13 1946

## 2013-12-26 ENCOUNTER — Encounter: Payer: Self-pay | Admitting: Emergency Medicine

## 2013-12-26 ENCOUNTER — Emergency Department
Admission: EM | Admit: 2013-12-26 | Discharge: 2013-12-26 | Disposition: A | Discharge status: Home / Self Care | Payer: 59 | Source: Home / Self Care | Attending: Family Medicine | Admitting: Family Medicine

## 2013-12-26 DIAGNOSIS — B029 Zoster without complications: Secondary | ICD-10-CM

## 2013-12-26 MED ORDER — TRAMADOL HCL 50 MG PO TABS: ORAL_TABLET | ORAL | Status: DC

## 2013-12-26 MED ORDER — VALACYCLOVIR HCL 1 G PO TABS: 1000.0000 mg | ORAL_TABLET | Freq: Three times a day (TID) | ORAL | Status: DC

## 2013-12-26 NOTE — ED Provider Notes (Signed)
CSN: [id number]     Arrival date & time 12/26/13  1837 History   First MD Initiated Contact with Patient 12/26/13 1913     Chief Complaint  Patient presents with  . Herpes Zoster      HPI Comments: Approximately one week ago patient developed a stinging/burning sensation on the left side of her neck, shoulder, upper back and anterior chest.  This was followed by rash that has persisted.  She has had myalgias, but feels well otherwise.  Patient is a 47 y.o. female presenting with rash. The history is provided by the patient.  Rash Pain location: left neck, chest, upper back, and shoulder. Pain quality: burning and stabbing   Pain radiates to:  Does not radiate Pain severity:  Mild Onset quality:  Sudden Duration:  1 week Timing:  Constant Progression:  Unchanged Chronicity:  New Relieved by:  Nothing Worsened by:  Nothing tried Ineffective treatments:  None tried Associated symptoms: no chills, no cough, no fatigue, no fever, no nausea, no sore throat and no vomiting     Past Medical History  Diagnosis Date  . Chicken pox   . Allergy   . Hypertension   . Hyperlipidemia   . Thyroid disease    Past Surgical History  Procedure Laterality Date  . Abdominal hysterectomy  2010    partial   Family History  Problem Relation Age of Onset  . Hypertension Mother   . Hypertension Father   . Arthritis Maternal Grandmother   . Heart disease Maternal Grandmother   . Arthritis Maternal Grandfather   . Heart disease Maternal Grandfather   . Arthritis Paternal Grandmother   . Heart disease Paternal Grandmother   . Arthritis Paternal Grandfather   . Heart disease Paternal Grandfather   . Diabetes Other    History  Substance Use Topics  . Smoking status: Former Games developer  . Smokeless tobacco: Never Used  . Alcohol Use: Not on file   OB History   Grav Para Term Preterm Abortions TAB SAB Ect Mult Living                 Review of Systems  Constitutional: Negative for fever,  chills and fatigue.  HENT: Negative for sore throat.   Respiratory: Negative for cough.   Gastrointestinal: Negative for nausea and vomiting.  Skin: Positive for rash.  Neurological: Positive for headaches.  All other systems reviewed and are negative.    Allergies  Cephalexin and Vicodin  Home Medications   Current Outpatient Rx  Name  Route  Sig  Dispense  Refill  . cholecalciferol (VITAMIN D) 1000 UNITS tablet   Oral   Take 1,000 Units by mouth daily.         Marland Kitchen dexlansoprazole (DEXILANT) 60 MG capsule   Oral   Take 1 capsule (60 mg total) by mouth daily.   90 capsule   3   . hydrochlorothiazide (HYDRODIURIL) 12.5 MG tablet   Oral   Take 12.5 mg by mouth daily.         Marland Kitchen levothyroxine (SYNTHROID, LEVOTHROID) 125 MCG tablet      TAKE 1 TABLET BY MOUTH ONCE DAILY   90 tablet   1   . methocarbamol (ROBAXIN) 500 MG tablet      TAKE 1 TABLET BY MOUTH TWICE DAILY   60 tablet   0   . naproxen (NAPROSYN) 500 MG tablet   Oral   Take 1 tablet (500 mg total) by mouth 2 (two) times  daily with a meal.   60 tablet   0   . traMADol (ULTRAM) 50 MG tablet      Take one-half to one tab by mouth at bedtime as needed for pain   15 tablet   0   . Triamcinolone Acetonide (TRIAMCINOLONE 0.1 % CREAM : EUCERIN) CREA   Topical   Apply 1 application topically 2 (two) times daily as needed.   1 each   3   . valACYclovir (VALTREX) 1000 MG tablet   Oral   Take 1 tablet (1,000 mg total) by mouth 3 (three) times daily.   21 tablet   0   . vitamin E 400 UNIT capsule   Oral   Take 400 Units by mouth daily.          BP 120/85  Pulse 87  Temp(Src) 98.5 F (36.9 C) (Oral)  Ht 5\' 6"  (1.676 m)  Wt 214 lb (97.07 kg)  BMI 34.56 kg/m2  SpO2 99%  LMP 08/10/2013 Physical Exam  Nursing note and vitals reviewed. Constitutional: She is oriented to person, place, and time. She appears well-developed and well-nourished. No distress.  Patient is obese (BMI 34.6)  HENT:    Head: Normocephalic.  Right Ear: External ear normal.  Left Ear: External ear normal.  Nose: Nose normal.  Mouth/Throat: Oropharynx is clear and moist. No oropharyngeal exudate.  Eyes: Conjunctivae and EOM are normal. Pupils are equal, round, and reactive to light.  Neck: Neck supple.  Cardiovascular: Normal heart sounds.   Pulmonary/Chest: Breath sounds normal.  Abdominal: There is no tenderness.  Musculoskeletal: She exhibits no edema.  Lymphadenopathy:    She has no cervical adenopathy.  Neurological: She is alert and oriented to person, place, and time.  Skin: Skin is warm and dry. Rash noted. Rash is vesicular.     There is a herpetiform erythematous eruption on left neck, left anterior chest, and upper back as noted on diagram.  No swelling, warmth, or tenderness to palpation    ED Course  Procedures  none      MDM   1. Herpes zoster    Begin Valtrex.  Tramadol for pain at bedtime. Followup with Family Doctor if not improved in one week.     Lattie Haw, MD 12/29/13 [id number]

## 2013-12-26 NOTE — ED Notes (Signed)
Shingles on left side neck, shoulder, scalp x 7 days

## 2014-01-19 ENCOUNTER — Other Ambulatory Visit: Payer: Self-pay | Admitting: Internal Medicine

## 2014-03-28 ENCOUNTER — Other Ambulatory Visit: Payer: Self-pay

## 2014-03-28 DIAGNOSIS — Z1231 Encounter for screening mammogram for malignant neoplasm of breast: Secondary | ICD-10-CM

## 2014-04-24 ENCOUNTER — Other Ambulatory Visit: Payer: Self-pay | Admitting: Family Medicine

## 2014-04-24 ENCOUNTER — Ambulatory Visit: Payer: 59

## 2014-04-24 MED ORDER — CLOBETASOL PROPIONATE 0.05 % EX OINT: 1.0000 "application " | TOPICAL_OINTMENT | Freq: Two times a day (BID) | CUTANEOUS | Status: DC

## 2014-04-24 MED ORDER — TRIAMCINOLONE 0.1 % CREAM:EUCERIN CREAM 1:1: 1.0000 "application " | TOPICAL_CREAM | Freq: Two times a day (BID) | CUTANEOUS | Status: DC | PRN

## 2014-04-24 MED ORDER — NAPROXEN 500 MG PO TABS: 500.0000 mg | ORAL_TABLET | Freq: Two times a day (BID) | ORAL | Status: DC

## 2014-04-24 NOTE — Telephone Encounter (Signed)
These medications were filled. We will need you to make an appt for a physical for after June.

## 2014-04-26 ENCOUNTER — Ambulatory Visit
Admission: RE | Admit: 2014-04-26 | Discharge: 2014-04-26 | Disposition: A | Discharge status: Home / Self Care | Payer: 59 | Source: Ambulatory Visit

## 2014-04-26 DIAGNOSIS — Z1231 Encounter for screening mammogram for malignant neoplasm of breast: Secondary | ICD-10-CM

## 2014-05-17 ENCOUNTER — Other Ambulatory Visit: Payer: Self-pay | Admitting: Internal Medicine

## 2014-06-29 ENCOUNTER — Other Ambulatory Visit: Payer: Self-pay | Admitting: Internal Medicine

## 2014-07-09 ENCOUNTER — Encounter (HOSPITAL_COMMUNITY): Payer: Self-pay | Admitting: Emergency Medicine

## 2014-07-09 ENCOUNTER — Emergency Department (HOSPITAL_COMMUNITY)
Admission: EM | Admit: 2014-07-09 | Discharge: 2014-07-09 | Disposition: A | Discharge status: Home / Self Care | Payer: 59 | Source: Home / Self Care | Attending: Family Medicine | Admitting: Family Medicine

## 2014-07-09 DIAGNOSIS — J029 Acute pharyngitis, unspecified: Secondary | ICD-10-CM

## 2014-07-09 LAB — POCT RAPID STREP A: STREPTOCOCCUS, GROUP A SCREEN (DIRECT): NEGATIVE

## 2014-07-09 NOTE — ED Provider Notes (Signed)
CSN: [id number]     Arrival date & time 07/09/14  1478 History   First MD Initiated Contact with Patient 07/09/14 1057     Chief Complaint  Patient presents with  . Sore Throat   (Consider location/radiation/quality/duration/timing/severity/associated sxs/prior Treatment) HPI Comments: 48 year old female presents complaining of sore throat since last night. She describes this as a scratchy feeling and feeling like her tonsils are swollen. She rates this as mild to moderate. She has no associated symptoms. She says she just wants to make sure she does not have strep throat. No known exposure and no other systemic symptoms.  Patient is a 48 y.o. female presenting with pharyngitis.  Sore Throat    Past Medical History  Diagnosis Date  . Chicken pox   . Allergy   . Hypertension   . Hyperlipidemia   . Thyroid disease    Past Surgical History  Procedure Laterality Date  . Abdominal hysterectomy  2010    partial   Family History  Problem Relation Age of Onset  . Hypertension Mother   . Hypertension Father   . Arthritis Maternal Grandmother   . Heart disease Maternal Grandmother   . Arthritis Maternal Grandfather   . Heart disease Maternal Grandfather   . Arthritis Paternal Grandmother   . Heart disease Paternal Grandmother   . Arthritis Paternal Grandfather   . Heart disease Paternal Grandfather   . Diabetes Other    History  Substance Use Topics  . Smoking status: Former Games developer  . Smokeless tobacco: Never Used  . Alcohol Use: Not on file   OB History   Grav Para Term Preterm Abortions TAB SAB Ect Mult Living                 Review of Systems  HENT: Positive for sore throat.   All other systems reviewed and are negative.   Allergies  Cephalexin and Vicodin  Home Medications   Prior to Admission medications   Medication Sig Start Date End Date Taking? Authorizing Provider  cholecalciferol (VITAMIN D) 1000 UNITS tablet Take 1,000 Units by mouth daily.     Historical Provider, MD  clobetasol ointment (TEMOVATE) 0.05 % Apply 1 application topically 2 (two) times daily. 04/24/14   Sheliah Hatch, MD  dexlansoprazole (DEXILANT) 60 MG capsule Take 1 capsule (60 mg total) by mouth daily. 04/20/13   Grayling Congress Lowne, DO  hydrochlorothiazide (HYDRODIURIL) 25 MG tablet TAKE 1 TABLET BY MOUTH ONCE DAILY. 06/29/14   Wanda Plump, MD  levothyroxine (SYNTHROID, LEVOTHROID) 125 MCG tablet TAKE 1 TABLET BY MOUTH ONCE DAILY    Wanda Plump, MD  methocarbamol (ROBAXIN) 500 MG tablet TAKE 1 TABLET BY MOUTH TWICE DAILY 10/12/13   Wanda Plump, MD  naproxen (NAPROSYN) 500 MG tablet Take 1 tablet (500 mg total) by mouth 2 (two) times daily with a meal. 04/24/14 04/24/15  Sheliah Hatch, MD  traMADol Janean Sark) 50 MG tablet Take one-half to one tab by mouth at bedtime as needed for pain 12/26/13   Lattie Haw, MD  Triamcinolone Acetonide (TRIAMCINOLONE 0.1 % CREAM : EUCERIN) CREA Apply 1 application topically 2 (two) times daily as needed. 04/24/14   Sheliah Hatch, MD  valACYclovir (VALTREX) 1000 MG tablet Take 1 tablet (1,000 mg total) by mouth 3 (three) times daily. 12/26/13   Lattie Haw, MD  vitamin E 400 UNIT capsule Take 400 Units by mouth daily.    Historical Provider, MD   BP 107/81  Pulse 63  Temp(Src) 97.8 F (36.6 C) (Oral)  Resp 16  SpO2 97% Physical Exam  Nursing note and vitals reviewed. Constitutional: She is oriented to person, place, and time. Vital signs are normal. She appears well-developed and well-nourished. No distress.  HENT:  Head: Normocephalic and atraumatic.  Right Ear: External ear normal.  Left Ear: Tympanic membrane and ear canal normal.  Nose: Nose normal.  Mouth/Throat: Uvula is midline and oropharynx is clear and moist. No oropharyngeal exudate or posterior oropharyngeal erythema.  Eyes: Conjunctivae are normal.  Neck: Normal range of motion. Neck supple.  Pulmonary/Chest: Effort normal. No respiratory distress.   Lymphadenopathy:    She has no cervical adenopathy.  Neurological: She is alert and oriented to person, place, and time. She has normal strength. Coordination normal.  Skin: Skin is warm and dry. No rash noted. She is not diaphoretic.  Psychiatric: She has a normal mood and affect. Judgment normal.    ED Course  Procedures (including critical care time) Labs Review Labs Reviewed  CULTURE, GROUP A STREP  POCT RAPID STREP A ([facility] CARE ONLY)    Imaging Review No results found.   MDM   1. Pharyngitis     Most likely viral infection. Continue to treat symptomatically as needed. Followup in a few days if no improvement.    Graylon GoodZachary H Ebert Forrester, PA-C 07/09/14 2008

## 2014-07-09 NOTE — ED Notes (Signed)
Patient c/o sore throat onset last night. Patient is unsure if she has had fever. Patient reports she has tried gargling with salt water and has taking ibuprofen with no relief. Pt is alert and oriented and in no acute distress.

## 2014-07-09 NOTE — Discharge Instructions (Signed)
Antibiotic Nonuse  Your caregiver felt that the infection or problem was not one that would be helped with an antibiotic. Infections may be caused by viruses or bacteria. Only a caregiver can tell which one of these is the likely cause of an illness. A cold is the most common cause of infection in both adults and children. A cold is a virus. Antibiotic treatment will have no effect on a viral infection. Viruses can lead to many lost days of work caring for sick children and many missed days of school. Children may catch as many as 10 "colds" or "flus" per year during which they can be tearful, cranky, and uncomfortable. The goal of treating a virus is aimed at keeping the ill person comfortable. Antibiotics are medications used to help the body fight bacterial infections. There are relatively few types of bacteria that cause infections but there are hundreds of viruses. While both viruses and bacteria cause infection they are very different types of germs. A viral infection will typically go away by itself within 7 to 10 days. Bacterial infections may spread or get worse without antibiotic treatment. Examples of bacterial infections are:  Sore throats (like strep throat or tonsillitis).  Infection in the lung (pneumonia).  Ear and skin infections. Examples of viral infections are:  Colds or flus.  Most coughs and bronchitis.  Sore throats not caused by Strep.  Runny noses. It is often best not to take an antibiotic when a viral infection is the cause of the problem. Antibiotics can kill off the helpful bacteria that we have inside our body and allow harmful bacteria to start growing. Antibiotics can cause side effects such as allergies, nausea, and diarrhea without helping to improve the symptoms of the viral infection. Additionally, repeated uses of antibiotics can cause bacteria inside of our body to become resistant. That resistance can be passed onto harmful bacterial. The next time you have  an infection it may be harder to treat if antibiotics are used when they are not needed. Not treating with antibiotics allows our own immune system to develop and take care of infections more efficiently. Also, antibiotics will work better for us when they are prescribed for bacterial infections. Treatments for a child that is ill may include:  Give extra fluids throughout the day to stay hydrated.  Get plenty of rest.  Only give your child over-the-counter or prescription medicines for pain, discomfort, or fever as directed by your caregiver.  The use of a cool mist humidifier may help stuffy noses.  Cold medications if suggested by your caregiver. Your caregiver may decide to start you on an antibiotic if:  The problem you were seen for today continues for a longer length of time than expected.  You develop a secondary bacterial infection. SEEK MEDICAL CARE IF:  Fever lasts longer than 5 days.  Symptoms continue to get worse after 5 to 7 days or become severe.  Difficulty in breathing develops.  Signs of dehydration develop (poor drinking, rare urinating, dark colored urine).  Changes in behavior or worsening tiredness (listlessness or lethargy). Document Released: 02/23/2002 Document Revised: 03/08/2012 Document Reviewed: 08/22/2009 [facility] Patient Information 2015 [place]. This information is not intended to replace advice given to you by your health care provider. Make sure you discuss any questions you have with your health care provider.  Pharyngitis Pharyngitis is redness, pain, and swelling (inflammation) of your pharynx.  CAUSES  Pharyngitis is usually caused by infection. Most of the time, these infections  are from viruses (viral) and are part of a cold. However, sometimes pharyngitis is caused by bacteria (bacterial). Pharyngitis can also be caused by allergies. Viral pharyngitis may be spread from person to person by coughing, sneezing, and personal items or  utensils (cups, forks, spoons, toothbrushes). Bacterial pharyngitis may be spread from person to person by more intimate contact, such as kissing.  SIGNS AND SYMPTOMS  Symptoms of pharyngitis include:   Sore throat.   Tiredness (fatigue).   Low-grade fever.   Headache.  Joint pain and muscle aches.  Skin rashes.  Swollen lymph nodes.  Plaque-like film on throat or tonsils (often seen with bacterial pharyngitis). DIAGNOSIS  Your health care provider will ask you questions about your illness and your symptoms. Your medical history, along with a physical exam, is often all that is needed to diagnose pharyngitis. Sometimes, a rapid strep test is done. Other lab tests may also be done, depending on the suspected cause.  TREATMENT  Viral pharyngitis will usually get better in 3-4 days without the use of medicine. Bacterial pharyngitis is treated with medicines that kill germs (antibiotics).  HOME CARE INSTRUCTIONS   Drink enough water and fluids to keep your urine clear or pale yellow.   Only take over-the-counter or prescription medicines as directed by your health care provider:   If you are prescribed antibiotics, make sure you finish them even if you start to feel better.   Do not take aspirin.   Get lots of rest.   Gargle with 8 oz of salt water ( tsp of salt per 1 qt of water) as often as every 1-2 hours to soothe your throat.   Throat lozenges (if you are not at risk for choking) or sprays may be used to soothe your throat. SEEK MEDICAL CARE IF:   You have large, tender lumps in your neck.  You have a rash.  You cough up green, yellow-brown, or bloody spit. SEEK IMMEDIATE MEDICAL CARE IF:   Your neck becomes stiff.  You drool or are unable to swallow liquids.  You vomit or are unable to keep medicines or liquids down.  You have severe pain that does not go away with the use of recommended medicines.  You have trouble breathing (not caused by a stuffy  nose). MAKE SURE YOU:   Understand these instructions.  Will watch your condition.  Will get help right away if you are not doing well or get worse. Document Released: 12/15/2005 Document Revised: 10/05/2013 Document Reviewed: 08/22/2013 [facility] Patient Information 2015 [place], [place]. This information is not intended to replace advice given to you by your health care provider. Make sure you discuss any questions you have with your health care provider.

## 2014-07-10 NOTE — ED Provider Notes (Signed)
Medical screening examination/treatment/procedure(s) were performed by resident physician or non-physician practitioner and as supervising physician I was immediately available for consultation/collaboration.   Barkley BrunsKINDL,JAMES DOUGLAS MD.   Linna HoffJames D Kindl, MD 07/10/14 2120

## 2014-07-11 LAB — CULTURE, GROUP A STREP

## 2014-07-14 ENCOUNTER — Telehealth: Payer: Self-pay | Admitting: *Deleted

## 2014-07-14 NOTE — Telephone Encounter (Signed)
Received Medical Release Form via fax from [facility].  Placed in folder for Dr. Drue NovelPaz to review and sign.//AB/CMA

## 2014-07-24 NOTE — Telephone Encounter (Signed)
Received completed and signed Medical Release Form from Dr. Drue NovelPaz.  All forms faxed to [facility] at [phone]).  Confirmation received.  Informed the pt that the form will be faxed, but she is due an office visit.  Pt understood and agreed to schedule an appt.//AB/CMA

## 2014-07-31 DIAGNOSIS — Z0279 Encounter for issue of other medical certificate: Secondary | ICD-10-CM

## 2015-02-22 ENCOUNTER — Telehealth: Payer: 59 | Admitting: Physician Assistant

## 2015-02-22 DIAGNOSIS — B9789 Other viral agents as the cause of diseases classified elsewhere: Principal | ICD-10-CM

## 2015-02-22 DIAGNOSIS — J069 Acute upper respiratory infection, unspecified: Secondary | ICD-10-CM

## 2015-02-22 MED ORDER — BENZONATATE 100 MG PO CAPS: 100.0000 mg | ORAL_CAPSULE | Freq: Three times a day (TID) | ORAL | Status: DC | PRN

## 2015-02-22 NOTE — Progress Notes (Signed)
We are sorry that you are not feeling well.  Here is how we plan to help!  Based on what you have shared with me it looks like you have upper respiratory tract inflammation that has resulted in a significant cough.  Inflammation and infection in the upper respiratory tract is commonly called bronchitis and has four common causes:  Allergies, Viral Infections, Acid Reflux and Bacterial Infections.  Allergies, viruses and acid reflux are treated by controlling symptoms or eliminating the cause. An example might be a cough caused by taking certain blood pressure medications. You stop the cough by changing the medication. Another example might be a cough caused by acid reflux. Controlling the reflux helps control the cough.  Based on your presentation I believe you most likely have A cough due to a virus.  This is called viral bronchitis and is best treated by rest, plenty of fluids and control of the cough.  You may use Ibuprofen or Tylenol as directed to help your symptoms.    In addition you may use A prescription cough medication called Tessalon Perles 100mg. You may take 1-2 capsules every 8 hours as needed for your cough.    HOME CARE . Only take medications as instructed by your medical team. . Complete the entire course of an antibiotic. . Drink plenty of fluids and get plenty of rest. . Avoid close contacts especially the very young and the elderly . Cover your mouth if you cough or cough into your sleeve. . Always remember to wash your hands . A steam or ultrasonic humidifier can help congestion.    GET HELP RIGHT AWAY IF: . You develop worsening fever. . You become short of breath . You cough up blood. . Your symptoms persist after you have completed your treatment plan MAKE SURE YOU   Understand these instructions.  Will watch your condition.  Will get help right away if you are not doing well or get worse.  Your e-visit answers were reviewed by a board certified advanced  clinical practitioner to complete your personal care plan.  Depending on the condition, your plan could have included both over the counter or prescription medications.  If there is a problem please reply  once you have received a response from your provider.  Your safety is important to us.  If you have drug allergies check your prescription carefully.    You can use MyChart to ask questions about today's visit, request a non-urgent call back, or ask for a work or school excuse.  You will get an e-mail in the next two days asking about your experience.  I hope that your e-visit has been valuable and will speed your recovery. Thank you for using e-visits.   

## 2015-03-09 ENCOUNTER — Other Ambulatory Visit: Payer: Self-pay | Admitting: Internal Medicine

## 2015-03-12 ENCOUNTER — Telehealth: Payer: Self-pay | Admitting: Internal Medicine

## 2015-03-12 ENCOUNTER — Other Ambulatory Visit: Payer: Self-pay

## 2015-03-12 MED ORDER — LEVOTHYROXINE SODIUM 125 MCG PO TABS: 125.0000 ug | ORAL_TABLET | Freq: Every day | ORAL | Status: DC

## 2015-03-12 NOTE — Telephone Encounter (Signed)
[facility] (#[facility] RF) to get Pt to appt 03/30/2015.

## 2015-03-12 NOTE — Telephone Encounter (Signed)
Caller name: Wende Neighborshomas, Twana D Relation to pt: self  Call back number: [phone]   Reason for call:  Pt requesting a refill levothyroxine (SYNTHROID, LEVOTHROID) 125 MCG tablet to hold her over until next appointment 03/30/15.

## 2015-03-29 ENCOUNTER — Other Ambulatory Visit: Payer: Self-pay

## 2015-03-30 ENCOUNTER — Encounter: Payer: Self-pay | Admitting: Internal Medicine

## 2015-03-30 ENCOUNTER — Ambulatory Visit (INDEPENDENT_AMBULATORY_CARE_PROVIDER_SITE_OTHER): Payer: 59 | Admitting: Internal Medicine

## 2015-03-30 VITALS — BP 128/72 | HR 73 | Temp 98.0°F | Ht 66.0 in | Wt 213.1 lb

## 2015-03-30 DIAGNOSIS — L853 Xerosis cutis: Secondary | ICD-10-CM | POA: Diagnosis not present

## 2015-03-30 DIAGNOSIS — I10 Essential (primary) hypertension: Secondary | ICD-10-CM

## 2015-03-30 DIAGNOSIS — E039 Hypothyroidism, unspecified: Secondary | ICD-10-CM | POA: Diagnosis not present

## 2015-03-30 LAB — CBC WITH DIFFERENTIAL/PLATELET
BASOS ABS: 0 10*3/uL (ref 0.0–0.1)
Basophils Relative: 0.3 % (ref 0.0–3.0)
Eosinophils Absolute: 0.1 10*3/uL (ref 0.0–0.7)
Eosinophils Relative: 2.3 % (ref 0.0–5.0)
HEMATOCRIT: 37 % (ref 36.0–46.0)
HEMOGLOBIN: 12.1 g/dL (ref 12.0–15.0)
Lymphocytes Relative: 28 % (ref 12.0–46.0)
Lymphs Abs: 1.3 10*3/uL (ref 0.7–4.0)
MCHC: 32.7 g/dL (ref 30.0–36.0)
Monocytes Absolute: 0.3 10*3/uL (ref 0.1–1.0)
Monocytes Relative: 6.7 % (ref 3.0–12.0)
Neutro Abs: 2.8 10*3/uL (ref 1.4–7.7)
Neutrophils Relative %: 62.7 % (ref 43.0–77.0)
Platelets: 249 10*3/uL (ref 150.0–400.0)
RBC: 5.49 Mil/uL — AB (ref 3.87–5.11)
RDW: 16.2 % — ABNORMAL HIGH (ref 11.5–15.5)
WBC: 4.5 10*3/uL (ref 4.0–10.5)

## 2015-03-30 LAB — COMPREHENSIVE METABOLIC PANEL
ALK PHOS: 80 U/L (ref 39–117)
ALT: 24 U/L (ref 0–35)
AST: 26 U/L (ref 0–37)
Albumin: 4 g/dL (ref 3.5–5.2)
BILIRUBIN TOTAL: 0.6 mg/dL (ref 0.2–1.2)
BUN: 13 mg/dL (ref 6–23)
CO2: 31 mEq/L (ref 19–32)
Calcium: 9.6 mg/dL (ref 8.4–10.5)
Chloride: 98 mEq/L (ref 96–112)
Creatinine, Ser: 0.86 mg/dL (ref 0.40–1.20)
GFR: 89.73 mL/min (ref >60.00)
GLUCOSE: 96 mg/dL (ref 70–99)
Potassium: 3 mEq/L — ABNORMAL LOW (ref 3.5–5.1)
Sodium: 135 mEq/L (ref 135–145)
Total Protein: 7.6 g/dL (ref 6.0–8.3)

## 2015-03-30 LAB — TSH: TSH: 0.95 u[IU]/mL (ref 0.35–4.50)

## 2015-03-30 MED ORDER — HYDROCHLOROTHIAZIDE 25 MG PO TABS: 25.0000 mg | ORAL_TABLET | Freq: Every day | ORAL | Status: DC

## 2015-03-30 MED ORDER — LEVOTHYROXINE SODIUM 125 MCG PO TABS: 125.0000 ug | ORAL_TABLET | Freq: Every day | ORAL | Status: DC

## 2015-03-30 MED ORDER — CLOBETASOL PROPIONATE 0.05 % EX OINT: 1.0000 "application " | TOPICAL_OINTMENT | Freq: Two times a day (BID) | CUTANEOUS | Status: AC

## 2015-03-30 MED ORDER — NAPROXEN 500 MG PO TABS: 500.0000 mg | ORAL_TABLET | Freq: Two times a day (BID) | ORAL | Status: DC

## 2015-03-30 MED ORDER — METHOCARBAMOL 500 MG PO TABS: 500.0000 mg | ORAL_TABLET | Freq: Every day | ORAL | Status: DC

## 2015-03-30 MED ORDER — PREDNISONE 10 MG PO TABS: ORAL_TABLET | ORAL | Status: DC

## 2015-03-30 NOTE — Assessment & Plan Note (Signed)
Back pain with some radiation to the right hip, MSK pain versus radiculopathy. Recommend conservative treatment with naproxen, GI precautions discussed, Robaxin and round of prednisone. If not better knows to call me.

## 2015-03-30 NOTE — Assessment & Plan Note (Signed)
Uses occasional topical steroid for dry skin and different rashes. Today presents with a rash at the neck, will let me know if that is not improving.

## 2015-03-30 NOTE — Assessment & Plan Note (Signed)
Good compliance with medication, reports good ambulatory BPs, refill meds and check a CMP

## 2015-03-30 NOTE — Patient Instructions (Signed)
Get your blood work before you leave   For pain: Continue naproxen  as needed for pain.  Always take it with food because may cause gastritis and ulcers.  If you notice nausea, stomach pain, change in the color of stools --->  Stop the medicine and let us know Robaxin at night Take prednisone as prescribed Call if not improving in the next 2 or 3 weeks     Come back to the office in 4-6 months   for a physical exam  Please schedule an appointment at the front desk    Come back fasting

## 2015-03-30 NOTE — Assessment & Plan Note (Addendum)
Good compliance of medication, labs and refill sent

## 2015-03-30 NOTE — Progress Notes (Signed)
Subjective:    Patient ID: Connie Gentry, female    DOB: 1966/09/04, 49 y.o.   MRN: [id number]  DOS:  03/30/2015 Type of visit - description : rov Interval history: Hypertension, good medication compliance, ambulatory BPs 110/70 when checked Hypothyroidism, good compliance with medications, needs a refill. 2 weeks history of back pain, right-sided, some radiation to the right hip and  posterior right thigh. Worse when she bends her torso. Taking  naproxen and Robaxin (had a leftover) w/ some help. No injury that she knows of.  Also has a rash at the neck for few days, no itching.  Review of Systems Denies chest pain or difficulty breathing. No lower extremity edema No nausea, vomiting, diarrhea, blood in the stools or abdominal pain  Past Medical History  Diagnosis Date  . Shingles 11-2013  . Allergy   . Hypertension   . Hyperlipidemia   . Thyroid disease     history of Graves' disease, status radioactive iodine 2003.  [name] menopause     Past Surgical History  Procedure Laterality Date  . Abdominal hysterectomy  2010    partial  . Oophorectomy  2010 at time of hysterectomy    side? L? R?    History   Social History  . Marital Status: Married    Spouse Name: N/A  . Number of Children: 2  . Years of Education: 2 year   Occupational History  . Admissions, CNA by trainning     Social History Main Topics  . Smoking status: Former Games developer  . Smokeless tobacco: Never Used  . Alcohol Use: 0.0 oz/week    0 Standard drinks or equivalent per week     Comment: wine   . Drug Use: No  . Sexual Activity: Not on file   Other Topics Concern  . Not on file   Social History Narrative    Originally from [place], moved to [place] and 2004 to the [place] area   Going through a divorce as of 03-2015        Medication List       This list is accurate as of: 03/30/15 11:59 PM.  Always use your most recent med list.               clobetasol ointment 0.05 %    Commonly known as:  TEMOVATE  Apply 1 application topically 2 (two) times daily.     hydrochlorothiazide 25 MG tablet  Commonly known as:  HYDRODIURIL  Take 1 tablet (25 mg total) by mouth daily.     levothyroxine 125 MCG tablet  Commonly known as:  SYNTHROID, LEVOTHROID  Take 1 tablet (125 mcg total) by mouth daily.     methocarbamol 500 MG tablet  Commonly known as:  ROBAXIN  Take 1 tablet (500 mg total) by mouth at bedtime.     MULTIVITAMIN ADULT PO  Take 1 tablet by mouth daily. OTC     naproxen 500 MG tablet  Commonly known as:  NAPROSYN  Take 1 tablet (500 mg total) by mouth 2 (two) times daily with a meal.     predniSONE 10 MG tablet  Commonly known as:  DELTASONE  4 tablets x 2 days, 3 tabs x 2 days, 2 tabs x 2 days, 1 tab x 2 days           Objective:   Physical Exam  Skin:      BP 128/72 mmHg  Pulse 73  Temp(Src) 98 F (  36.7 C) (Oral)  Ht 5\' 6"  (1.676 m)  Wt 213 lb 2 oz (96.673 kg)  BMI 34.42 kg/m2  SpO2 96% General:   Well developed, well nourished . NAD.  HEENT:  Normocephalic . Face symmetric, atraumatic Lungs:  CTA B Normal respiratory effort, no intercostal retractions, no accessory muscle use. Heart: RRR,  no murmur.  Muscle skeletal: no pretibial edema bilaterally ; posterior and gait not antalgic. No TTP at the trochanteric bursas. Skin: Not pale. Not jaundice Neurologic:  alert & oriented X3.  Speech normal, gait appropriate for age and unassisted DTRs symmetric, strength symmetric. Psych--  Cognition and judgment appear intact.  Cooperative with normal attention span and concentration.  Behavior appropriate. No anxious or depressed appearing.        Assessment & Plan:    Going through a divorce, ++ stress, fortunately overall is getting better

## 2015-03-30 NOTE — Progress Notes (Signed)
Pre visit review using our clinic review tool, if applicable. No additional management support is needed unless otherwise documented below in the visit note. 

## 2015-04-02 ENCOUNTER — Telehealth: Payer: Self-pay | Admitting: Internal Medicine

## 2015-04-02 DIAGNOSIS — M79672 Pain in left foot: Principal | ICD-10-CM

## 2015-04-02 DIAGNOSIS — M79671 Pain in right foot: Secondary | ICD-10-CM

## 2015-04-02 NOTE — Telephone Encounter (Signed)
Caller name: Wende Neighborshomas, Saydee D Relation to pt: self  Call back number: [phone] Pharmacy:  Reason for call:  [facility]  and requesting orders for an x ray for foot. Pt was moving furniture yesterday and furniture fell on feet. Please advise pt stated best # [phone]

## 2015-04-02 NOTE — Telephone Encounter (Signed)
Please advise 

## 2015-04-02 NOTE — Telephone Encounter (Signed)
Please enter a order for a foot  x-ray, DX injury. I won't be in town this week but if the x-ray is abnormal she must be seen. Also needs to be seen if she has severe pain or any toe-foot  deformities.

## 2015-04-03 NOTE — Telephone Encounter (Signed)
X-ray ordered for both feet.

## 2015-04-04 ENCOUNTER — Ambulatory Visit (HOSPITAL_COMMUNITY)
Admission: RE | Admit: 2015-04-04 | Discharge: 2015-04-04 | Disposition: A | Discharge status: Home / Self Care | Payer: 59 | Source: Ambulatory Visit | Attending: Internal Medicine | Admitting: Internal Medicine

## 2015-04-04 DIAGNOSIS — M79671 Pain in right foot: Secondary | ICD-10-CM | POA: Insufficient documentation

## 2015-04-04 DIAGNOSIS — M79672 Pain in left foot: Secondary | ICD-10-CM | POA: Diagnosis not present

## 2015-06-14 ENCOUNTER — Telehealth: Payer: 59 | Admitting: Physician Assistant

## 2015-06-14 DIAGNOSIS — J329 Chronic sinusitis, unspecified: Principal | ICD-10-CM

## 2015-06-14 DIAGNOSIS — B9789 Other viral agents as the cause of diseases classified elsewhere: Secondary | ICD-10-CM

## 2015-06-14 MED ORDER — FLUTICASONE PROPIONATE 50 MCG/ACT NA SUSP: 2.0000 | Freq: Every day | NASAL | Status: DC

## 2015-06-14 NOTE — Progress Notes (Signed)
We are sorry that you are not feeling well.  Here is how we plan to help!  Based on what you have shared with me it looks like you have sinusitis.  Sinusitis is inflammation and infection in the sinus cavities of the head.  Based on your presentation I believe you most likely have Acute Viral Sinusitis. This is an infection most likely caused by a virus.  There is not specific treatment for viral sinusitis other than to help you with the symptoms until the infection runs it's course.  You may use an oral decongestant such as Mucinex D or if you have glaucoma or high blood pressure use plain Mucinex.  Saline nasal spray help and can safely be used as often as needed for congestion, I have prescribed fluticasone nasal spray. Spray two sprays in each nostril twice a day to help reduce your symptoms.  Some authorities believe that zinc sprays or the use of Echinacea may shorten the course of your symptoms.  Sinus infections are not as easily transmitted as other respiratory infection, however we still recommend that you avoid close contact with loved ones, especially the very young and elderly.  Remember to wash your hands thoroughly throughout the day as this is the number one way to prevent the spread of infection!  Home Care:  Only take medications as instructed by your medical team.  Complete the entire course of an antibiotic.  Do not take these medications with alcohol.  A steam or ultrasonic humidifier can help congestion.  You can place a towel over your head and breathe in the steam from hot water coming from a faucet.  Avoid close contacts especially the very young and the elderly.  Cover your mouth when you cough or sneeze.  Always remember to wash your hands.  Get Help Right Away If:  You develop worsening fever or sinus pain.  You develop a severe head ache or visual changes.  Your symptoms persist after you have completed your treatment plan.  Make sure you  Understand these  instructions.  Will watch your condition.  Will get help right away if you are not doing well or get worse.  Your e-visit answers were reviewed by a board certified advanced clinical practitioner to complete your personal care plan.  Depending on the condition, your plan could have included both over the counter or prescription medications.  If there is a problem please reply  once you have received a response from your provider.  Your safety is important to us.  If you have drug allergies check your prescription carefully.    You can use MyChart to ask questions about today's visit, request a non-urgent call back, or ask for a work or school excuse.  You will get an e-mail in the next two days asking about your experience.  I hope that your e-visit has been valuable and will speed your recovery. Thank you for using e-visits.    

## 2015-06-22 ENCOUNTER — Ambulatory Visit (INDEPENDENT_AMBULATORY_CARE_PROVIDER_SITE_OTHER): Payer: 59 | Admitting: Medical

## 2015-06-22 ENCOUNTER — Encounter: Payer: Self-pay | Admitting: Medical

## 2015-06-22 ENCOUNTER — Encounter: Payer: Self-pay | Admitting: Internal Medicine

## 2015-06-22 VITALS — BP 129/73 | HR 97 | Temp 100.5°F | Ht 66.0 in | Wt 202.4 lb

## 2015-06-22 DIAGNOSIS — N898 Other specified noninflammatory disorders of vagina: Secondary | ICD-10-CM | POA: Diagnosis not present

## 2015-06-22 DIAGNOSIS — J029 Acute pharyngitis, unspecified: Secondary | ICD-10-CM

## 2015-06-22 DIAGNOSIS — N390 Urinary tract infection, site not specified: Secondary | ICD-10-CM

## 2015-06-22 DIAGNOSIS — R82998 Other abnormal findings in urine: Secondary | ICD-10-CM

## 2015-06-22 LAB — POCT URINALYSIS DIPSTICK
Bilirubin, UA: NEGATIVE
Blood, UA: POSITIVE
Glucose, UA: NEGATIVE
KETONES UA: 5
Nitrite, UA: NEGATIVE
Protein, UA: 15
Spec Grav, UA: 1.025
Urobilinogen, UA: 0.2
pH, UA: 5

## 2015-06-22 LAB — POCT RAPID STREP A (OFFICE): Rapid Strep A Screen: NEGATIVE

## 2015-06-22 MED ORDER — AMOXICILLIN-POT CLAVULANATE 875-125 MG PO TABS: 1.0000 | ORAL_TABLET | Freq: Two times a day (BID) | ORAL | Status: DC

## 2015-06-22 NOTE — Progress Notes (Signed)
Pre visit review using our clinic review tool, if applicable. No additional management support is needed unless otherwise documented below in the visit note. 

## 2015-06-22 NOTE — Progress Notes (Signed)
   Subjective:    Patient ID: Connie Gentry, female    DOB: Nov 07, 1966, 49 y.o.   MRN: [id number]  HPI  Pt has sore throat for one week. The symptoms are gradually getting worse. Associated symptom.  Body aches-no Fever- yes. Chills-yes HA-no ha.  Neck symptoms-rt side submandiblur sore now. Lymph node enlargement-yes Rash-no Painful swallowing- yes. Hurts on the right side more. Lt side was also painful early on.  Recent strep contact- Not the she knows. Pt did go on camping trip with about 15 adults.    Slight yellow discharge vaginal. 1 week ago and persists. No itching, no odor, no burning. No vaginal pain. States not sexually active.    Hx of reaction to cephalexin and doxcycyline.  Review of Systems See hpi    Objective:   Physical Exam   General- No acute distress, pleasant pt.  Neck- from, No nuccal rigidity, Mild submandibular node hypertrophy both sides but more prominent rt sub node and more tender.  Lungs- Clear even and unlabored.  Heart- Regular, rate and rhythm. HEENT- Head- normocephalic Eyes- PEERL bilaterally. Ears- Canals clear, normal tm's bilaterally. Nose- No frontal or maxillary sinus tenderness to palpation. Turbinates normal. Throat- posterior pharynx shows  1+  tonsillar hypertrophy plus, mild  erythma,   No discharge.   Neurologic- CN III- XII grossly intact.    Abdomen Inspection:-Inspection Normal.  Palpation/Perucssion: Palpation and Percussion of the abdomen reveal- Non Tender, No Rebound tenderness, No rigidity(Guarding) and No Palpable abdominal masses.  Liver:-Normal.  Spleen:- Normal.       Assessment & Plan:  Your strep test was negative. However, your physical exam and clinical presentation is suspicious for strep and it is important to note that rapid strep test can be falsely negative. So I am going to give you augmentin antibiotic today based on your exam and clinical presentation.  If you symptoms worsen or  change over weekend then ED evaluation  For your vaginal dc I am doing urine ancillary studies and ua today  Rest hydrate, tylenol for fever, and warm salt water gargles.   Follow up in 7 days or as needed.

## 2015-06-22 NOTE — Patient Instructions (Addendum)
Your strep test was negative. However, your physical exam and clinical presentation is suspicious for strep and it is important to note that rapid strep test can be falsely negative. So I am going to give you augmentin antibiotic today based on your exam and clinical presentation.  If you symptoms worsen or change over weekend then ED evaluation  For your vaginal dc I am doing urine ancillary studies and ua today  Rest hydrate, tylenol for fever, and warm salt water gargles.   Follow up in 7 days or as needed.   Prior augmentin use and no side effect or reaction.

## 2015-06-26 LAB — URINE CULTURE: Colony Count: 50000

## 2015-06-27 LAB — URINE CYTOLOGY ANCILLARY ONLY
BACTERIAL VAGINITIS: NEGATIVE
Candida vaginitis: NEGATIVE

## 2015-08-08 ENCOUNTER — Encounter (HOSPITAL_COMMUNITY): Payer: Self-pay

## 2015-08-08 ENCOUNTER — Emergency Department (HOSPITAL_COMMUNITY)
Admission: EM | Admit: 2015-08-08 | Discharge: 2015-08-08 | Disposition: A | Discharge status: Home / Self Care | Payer: 59 | Attending: Emergency Medicine | Admitting: Emergency Medicine

## 2015-08-08 ENCOUNTER — Other Ambulatory Visit: Payer: Self-pay | Admitting: Cardiovascular Disease

## 2015-08-08 ENCOUNTER — Emergency Department (HOSPITAL_COMMUNITY): Payer: 59

## 2015-08-08 DIAGNOSIS — Z791 Long term (current) use of non-steroidal anti-inflammatories (NSAID): Secondary | ICD-10-CM | POA: Insufficient documentation

## 2015-08-08 DIAGNOSIS — Z7952 Long term (current) use of systemic steroids: Secondary | ICD-10-CM | POA: Insufficient documentation

## 2015-08-08 DIAGNOSIS — Z8619 Personal history of other infectious and parasitic diseases: Secondary | ICD-10-CM | POA: Insufficient documentation

## 2015-08-08 DIAGNOSIS — E0500 Thyrotoxicosis with diffuse goiter without thyrotoxic crisis or storm: Secondary | ICD-10-CM | POA: Insufficient documentation

## 2015-08-08 DIAGNOSIS — Z792 Long term (current) use of antibiotics: Secondary | ICD-10-CM | POA: Diagnosis not present

## 2015-08-08 DIAGNOSIS — I4891 Unspecified atrial fibrillation: Secondary | ICD-10-CM | POA: Insufficient documentation

## 2015-08-08 DIAGNOSIS — Z87891 Personal history of nicotine dependence: Secondary | ICD-10-CM | POA: Diagnosis not present

## 2015-08-08 DIAGNOSIS — I480 Paroxysmal atrial fibrillation: Secondary | ICD-10-CM

## 2015-08-08 DIAGNOSIS — I10 Essential (primary) hypertension: Secondary | ICD-10-CM | POA: Insufficient documentation

## 2015-08-08 DIAGNOSIS — Z79899 Other long term (current) drug therapy: Secondary | ICD-10-CM | POA: Insufficient documentation

## 2015-08-08 LAB — BASIC METABOLIC PANEL
Anion gap: 10 (ref 5–15)
BUN: 11 mg/dL (ref 6–20)
CO2: 24 mmol/L (ref 22–32)
Calcium: 8.9 mg/dL (ref 8.9–10.3)
Chloride: 106 mmol/L (ref 101–111)
Creatinine, Ser: 0.74 mg/dL (ref 0.44–1.00)
GFR calc Af Amer: >60 mL/min (ref >60)
GLUCOSE: 106 mg/dL — AB (ref 65–99)
Potassium: 3.6 mmol/L (ref 3.5–5.1)
SODIUM: 140 mmol/L (ref 135–145)

## 2015-08-08 LAB — I-STAT TROPONIN, ED: TROPONIN I, POC: 0.01 ng/mL (ref 0.00–0.08)

## 2015-08-08 LAB — TSH: TSH: 2.136 u[IU]/mL (ref 0.350–4.500)

## 2015-08-08 LAB — RAPID URINE DRUG SCREEN, HOSP PERFORMED
Amphetamines: NOT DETECTED
Barbiturates: NOT DETECTED
Benzodiazepines: NOT DETECTED
Cocaine: NOT DETECTED
OPIATES: NOT DETECTED
Tetrahydrocannabinol: NOT DETECTED

## 2015-08-08 LAB — CBC WITH DIFFERENTIAL/PLATELET
Basophils Absolute: 0 10*3/uL (ref 0.0–0.1)
Basophils Relative: 0 % (ref 0–1)
EOS PCT: 6 % — AB (ref 0–5)
Eosinophils Absolute: 0.3 10*3/uL (ref 0.0–0.7)
HCT: 35 % — ABNORMAL LOW (ref 36.0–46.0)
Hemoglobin: 11.6 g/dL — ABNORMAL LOW (ref 12.0–15.0)
Lymphocytes Relative: 40 % (ref 12–46)
Lymphs Abs: 2.2 10*3/uL (ref 0.7–4.0)
MCH: 21.8 pg — ABNORMAL LOW (ref 26.0–34.0)
MCHC: 33.1 g/dL (ref 30.0–36.0)
MCV: 65.7 fL — ABNORMAL LOW (ref 78.0–100.0)
MONOS PCT: 5 % (ref 3–12)
Monocytes Absolute: 0.3 10*3/uL (ref 0.1–1.0)
NEUTROS ABS: 2.6 10*3/uL (ref 1.7–7.7)
Neutrophils Relative %: 49 % (ref 43–77)
Platelets: 240 10*3/uL (ref 150–400)
RBC: 5.33 MIL/uL — AB (ref 3.87–5.11)
RDW: 17.2 % — ABNORMAL HIGH (ref 11.5–15.5)
WBC: 5.4 10*3/uL (ref 4.0–10.5)

## 2015-08-08 LAB — T4, FREE: Free T4: 0.87 ng/dL (ref 0.61–1.12)

## 2015-08-08 LAB — ETHANOL

## 2015-08-08 LAB — MAGNESIUM: Magnesium: 1.9 mg/dL (ref 1.7–2.4)

## 2015-08-08 LAB — POC URINE PREG, ED: Preg Test, Ur: NEGATIVE

## 2015-08-08 MED ORDER — SODIUM CHLORIDE 0.9 % IV BOLUS (SEPSIS)
1000.0000 mL | Freq: Once | INTRAVENOUS | Status: AC
  Administered 2015-08-08: 1000 mL via INTRAVENOUS

## 2015-08-08 MED ORDER — METOPROLOL TARTRATE 25 MG PO TABS: 12.5000 mg | ORAL_TABLET | Freq: Two times a day (BID) | ORAL | Status: DC

## 2015-08-08 MED ORDER — RIVAROXABAN 20 MG PO TABS: 20.0000 mg | ORAL_TABLET | Freq: Every day | ORAL | Status: DC

## 2015-08-08 MED ORDER — APIXABAN 5 MG PO TABS: 5.0000 mg | ORAL_TABLET | Freq: Two times a day (BID) | ORAL | Status: DC

## 2015-08-08 MED ORDER — POTASSIUM CHLORIDE CRYS ER 20 MEQ PO TBCR
40.0000 meq | EXTENDED_RELEASE_TABLET | Freq: Once | ORAL | Status: AC
  Administered 2015-08-08: 40 meq via ORAL
  Filled 2015-08-08: qty 2

## 2015-08-08 MED ORDER — DILTIAZEM HCL 25 MG/5ML IV SOLN
20.0000 mg | Freq: Once | INTRAVENOUS | Status: AC
  Administered 2015-08-08: 20 mg via INTRAVENOUS
  Filled 2015-08-08: qty 5

## 2015-08-08 MED ORDER — MAGNESIUM SULFATE 2 GM/50ML IV SOLN
2.0000 g | Freq: Once | INTRAVENOUS | Status: AC
  Administered 2015-08-08: 2 g via INTRAVENOUS
  Filled 2015-08-08: qty 50

## 2015-08-08 MED ORDER — METOPROLOL TARTRATE 25 MG PO TABS
12.5000 mg | ORAL_TABLET | Freq: Two times a day (BID) | ORAL | Status: DC
  Administered 2015-08-08: 12.5 mg via ORAL
  Filled 2015-08-08: qty 1

## 2015-08-08 MED ORDER — FLECAINIDE ACETATE 100 MG PO TABS
300.0000 mg | ORAL_TABLET | Freq: Once | ORAL | Status: AC
  Administered 2015-08-08: 300 mg via ORAL
  Filled 2015-08-08: qty 3

## 2015-08-08 MED ORDER — RIVAROXABAN 20 MG PO TABS
20.0000 mg | ORAL_TABLET | Freq: Once | ORAL | Status: AC
  Administered 2015-08-08: 20 mg via ORAL
  Filled 2015-08-08: qty 1

## 2015-08-08 NOTE — Progress Notes (Signed)
ANTICOAGULATION CONSULT NOTE - Initial Consult  Pharmacy Consult for xarelto Indication: atrial fibrillation  Allergies  Allergen Reactions  . Cephalexin Nausea And Vomiting  . Vicodin [Hydrocodone-Acetaminophen] Nausea And Vomiting    Patient Measurements:    Vital Signs: Temp: 98 F (36.7 C) (08/10 0353) Temp Source: Oral (08/10 0353) BP: 113/77 mmHg (08/10 1030) Pulse Rate: 63 (08/10 1030)  Labs:  Recent Labs  08/08/15 0456  HGB 11.6*  HCT 35.0*  PLT 240  CREATININE 0.74    CrCl cannot be calculated (Unknown ideal weight.).   Medical History: Past Medical History  Diagnosis Date  . Shingles 11-2013  . Allergy   . Hypertension   . Hyperlipidemia   . Thyroid disease     history of Graves' disease, status radioactive iodine 2003.  . Early menopause    Assessment: 85 yof with new afib to start on xarelto for anticoagulation. Baseline H/H is slightly low but platelets are WNL. She is not on any other anticoagulation aside from aspirin PTA.   Goal of Therapy:  Therapeutic anticoagulation Monitor platelets by anticoagulation protocol: Yes   Plan:  - Xarelto  PO daily - Education provided to patient  - Monitor for bleeding  Keitha Kolk, Drake Leach 08/08/2015,11:00 AM

## 2015-08-08 NOTE — Discharge Instructions (Signed)
Atrial Fibrillation Connie Gentry, take your blood thinner everyday and see cardiology within 3 days for close follow up.  If any symptoms worsen or return, come back to the ED immediately. Thank you. Atrial fibrillation is a condition that causes your heart to beat irregularly. It may also cause your heart to beat faster than normal. Atrial fibrillation can prevent your heart from pumping blood normally. It increases your risk of stroke and heart problems. HOME CARE  Take medications as told by your doctor.  Only take medications that your doctor says are safe. Some medications can make the condition worse or happen again.  If blood thinners were prescribed by your doctor, take them exactly as told. Too much can cause bleeding. Too little and you will not have the needed protection against stroke and other problems.  Perform blood tests at home if told by your doctor.  Perform blood tests exactly as told by your doctor.  Do not drink alcohol.  Do not drink beverages with caffeine such as coffee, soda, and some teas.  Maintain a healthy weight.  Do not use diet pills unless your doctor says they are safe. They may make heart problems worse.  Follow diet instructions as told by your doctor.  Exercise regularly as told by your doctor.  Keep all follow-up appointments. GET HELP IF:  You notice a change in the speed, rhythm, or strength of your heartbeat.  You suddenly begin peeing (urinating) more often.  You get tired more easily when moving or exercising. GET HELP RIGHT AWAY IF:   You have chest or belly (abdominal) pain.  You feel sick to your stomach (nauseous).  You are short of breath.  You suddenly have swollen feet and ankles.  You feel dizzy.  You face, arms, or legs feel numb or weak.  There is a change in your vision or speech. MAKE SURE YOU:   Understand these instructions.  Will watch your condition.  Will get help right away if you are not doing well or  get worse. Document Released: 09/23/2008 Document Revised: 05/01/2014 Document Reviewed: 01/25/2013 [facility] Patient Information 2015 [place], [place]. This information is not intended to replace advice given to you by your health care provider. Make sure you discuss any questions you have with your health care provider.

## 2015-08-08 NOTE — Consult Note (Addendum)
Referring Physician: Dr. Mora Bellman Primary Physician: Primary Cardiologist: Reason for Consultation: new onset AF RVR   HPI: Connie Gentry is a 49 yo woman with h/o significant for graves disease s/p ablation around 2002 maintained on synthroid and HTN who presents with palpitations.  She states she woke up from sleep with a fast fluttering sensation in her chest.  No other symptoms.  She took an aspirin and eventually decided to call 911.  On EMS arrival, she was tachy in the 170 bpm range.  She received multiple doses of diltiazem in ED and currently remains tachycardic.  She reports occasional flutters but are described as a few beats, nothing sustained.  She feels confident that her onset was this past night and that she would feel it if it had been sustained previously.      Review of Systems:     Cardiac Review of Systems: {Y] = yes  = no  Chest Pain [    ]  Resting SOB [   ] Exertional SOB  [  ]  Orthopnea [  ]   Pedal Edema [   ]    Palpitations [ x ] Syncope  [  ]   Presyncope [   ]  General Review of Systems: [Y] = yes [  ]=no Constitional: recent weight change [  ]; anorexia [  ]; fatigue [  ]; nausea [  ]; night sweats [  ]; fever [  ]; or chills [  ];                                                                     Eyes : blurred vision [  ]; diplopia [   ]; vision changes [  ];  Amaurosis fugax[  ]; Resp: cough [  ];  wheezing[  ];  hemoptysis[  ];  PND [  ];  GI:  gallstones[  ], vomiting[  ];  dysphagia[  ]; melena[  ];  hematochezia [  ]; heartburn[  ];   GU: kidney stones [  ]; hematuria[  ];   dysuria [  ];  nocturia[  ]; incontinence [  ];             Skin: rash, swelling[  ];, hair loss[  ];  peripheral edema[  ];  or itching[  ]; Musculosketetal: myalgias[  ];  joint swelling[  ];  joint erythema[  ];  joint pain[  ];  back pain[  ];  Heme/Lymph: bruising[  ];  bleeding[  ];  anemia[  ];  Neuro: TIA[  ];  headaches[  ];  stroke[  ];  vertigo[  ];  seizures[  ];    paresthesias[  ];  difficulty walking[  ];  Psych:depression[  ]; anxiety[  ];  Endocrine: diabetes[  ];  thyroid dysfunction[  ];  Other:  Past Medical History  Diagnosis Date  . Shingles 11-2013  . Allergy   . Hypertension   . Hyperlipidemia   . Thyroid disease     history of Graves' disease, status radioactive iodine 2003.  . Early menopause      (Not in a hospital admission)   . flecainide  300 mg Oral Once  . potassium  chloride SA  40 mEq Oral Once    Infusions:    Allergies  Allergen Reactions  . Cephalexin Nausea And Vomiting  . Vicodin [Hydrocodone-Acetaminophen] Nausea And Vomiting    Social History   Social History  . Marital Status: Married    Spouse Name: N/A  . Number of Children: 2  . Years of Education: 2 year   Occupational History  . Admissions, CNA by trainning     Social History Main Topics  . Smoking status: Former Games developer  . Smokeless tobacco: Never Used  . Alcohol Use: 0.0 oz/week    0 Standard drinks or equivalent per week     Comment: wine   . Drug Use: No  . Sexual Activity: Not on file   Other Topics Concern  . Not on file   Social History Narrative    Originally from [place], moved to [place] and 2004 to the [place] area   Going through a divorce as of 03-2015    Family History  Problem Relation Age of Onset  . Hypertension Mother   . Hypertension Father   . Arthritis Maternal Grandmother   . Heart disease Maternal Grandmother   . Arthritis Maternal Grandfather   . Heart disease Maternal Grandfather   . Arthritis Paternal Grandmother   . Heart disease Paternal Grandmother   . Arthritis Paternal Grandfather   . Heart disease Paternal Grandfather   . Diabetes Other     PHYSICAL EXAM: Filed Vitals:   08/08/15 0522  BP: 139/73  Pulse: 141  Temp:   Resp: 19    No intake or output data in the 24 hours ending 08/08/15 0552  General:  Well appearing. No respiratory difficulty HEENT: normal Neck: supple. no JVD.  Carotids 2+ bilat; no bruits. No lymphadenopathy or thryomegaly appreciated. Cor: PMI nondisplaced. Regular rate & rhythm. No rubs, gallops or murmurs. Lungs: clear Abdomen: soft, nontender, nondistended. No hepatosplenomegaly. No bruits or masses. Good bowel sounds. Extremities: no cyanosis, clubbing, rash, edema Neuro: alert & oriented x 3, cranial nerves grossly intact. moves all 4 extremities w/o difficulty. Affect pleasant.  ECG: AF RVR  Results for orders placed or performed during the hospital encounter of 08/08/15 (from the past 24 hour(s))  I-stat troponin, ED     Status: None   Collection Time: 08/08/15  4:32 AM  Result Value Ref Range   Troponin i, poc 0.01 0.00 - 0.08 ng/mL   Comment 3          POC urine preg, ED (not at [facility])     Status: None   Collection Time: 08/08/15  4:33 AM  Result Value Ref Range   Preg Test, Ur NEGATIVE NEGATIVE  Urine rapid drug screen (hosp performed)     Status: None   Collection Time: 08/08/15  4:54 AM  Result Value Ref Range   Opiates NONE DETECTED NONE DETECTED   Cocaine NONE DETECTED NONE DETECTED   Benzodiazepines NONE DETECTED NONE DETECTED   Amphetamines NONE DETECTED NONE DETECTED   Tetrahydrocannabinol NONE DETECTED NONE DETECTED   Barbiturates NONE DETECTED NONE DETECTED  Ethanol     Status: None   Collection Time: 08/08/15  4:55 AM  Result Value Ref Range   Alcohol, Ethyl (B) <5 <5 mg/dL  CBC with Differential/Platelet     Status: Abnormal   Collection Time: 08/08/15  4:56 AM  Result Value Ref Range   WBC 5.4 4.0 - 10.5 K/uL   RBC 5.33 (H) 3.87 - 5.11 MIL/uL  Hemoglobin 11.6 (L) 12.0 - 15.0 g/dL   HCT 16.1 (L) 09.6 - 04.5 %   MCV 65.7 (L) 78.0 - 100.0 fL   MCH 21.8 (L) 26.0 - 34.0 pg   MCHC 33.1 30.0 - 36.0 g/dL   RDW 40.9 (H) 81.1 - 91.4 %   Platelets 240 150 - 400 K/uL   Neutrophils Relative % 49 43 - 77 %   Lymphocytes Relative 40 12 - 46 %   Monocytes Relative 5 3 - 12 %   Eosinophils Relative 6 (H) 0 - 5 %    Basophils Relative 0 0 - 1 %   Neutro Abs 2.6 1.7 - 7.7 K/uL   Lymphs Abs 2.2 0.7 - 4.0 K/uL   Monocytes Absolute 0.3 0.1 - 1.0 K/uL   Eosinophils Absolute 0.3 0.0 - 0.7 K/uL   Basophils Absolute 0.0 0.0 - 0.1 K/uL   Smear Review MORPHOLOGY UNREMARKABLE   Basic metabolic panel     Status: Abnormal   Collection Time: 08/08/15  4:56 AM  Result Value Ref Range   Sodium 140 135 - 145 mmol/L   Potassium 3.6 3.5 - 5.1 mmol/L   Chloride 106 101 - 111 mmol/L   CO2 24 22 - 32 mmol/L   Glucose, Bld 106 (H) 65 - 99 mg/dL   BUN 11 6 - 20 mg/dL   Creatinine, Ser 7.82 0.44 - 1.00 mg/dL   Calcium 8.9 8.9 - 95.6 mg/dL   GFR calc non Af Amer >60 >60 mL/min   GFR calc Af Amer >60 >60 mL/min   Anion gap 10 5 - 15  Magnesium     Status: None   Collection Time: 08/08/15  4:56 AM  Result Value Ref Range   Magnesium 1.9 1.7 - 2.4 mg/dL   No results found.   ASSESSMENT: Connie Gentry is a 49 yo woman with h/o significant for graves disease s/p ablation around 2002 maintained on synthroid and HTN who presents with palpitations and found to be in AF RVR.  No clear etiology at this time.  She denies any recent illness, change in synthroid or excessive stimulants.  Given her age and first onset, we discussed options including cardioversion (both chemical and electrical) to restore SR.  Her CHADSVASc score is 2 (HTN and female sex).  PLAN/DISCUSSION: Diltiazem IV now Flecainide 300 mg po 30 minutes after diltiazem administered Start NOAC for 4 weeks (consider eliquis 5 mg bid) If above successful, can discharge from ED with follow up in EP clinic at which time she will need an echocardiogram to confirm normal LVF and valves (normal on exam).     Addendum: Connie Gentry is a 56F with Graves disease s/p ablation and HTN who presented to the ED this AM with new onset atrial fibrillation.  She had palpitations and fatigue but denied CP, SOB, LE edema, orthopnea or PND.  She denies any prior similar episodes.  She  felt confident of the time of onset and discussed options for pharmacologic cardioversion with Dr. Onalee Hua.  She received flecainide 300mg  po and converted to sinus rhythm this am.  She is feeling well, though feels fatigued.  Thyroid function was wnl.  She does not typically consume caffeine, but had one drink yesterday.  Exam BP 116/78 mmHg  Pulse 73  Temp(Src) 98 F (36.7 C) (Oral)  Resp 17  SpO2 100%  LMP 06/08/2015  Gen: well-appearing.  Neck: No JVD CV: RRR.  No m/r/g Lungs: CTAB Ext: WWP.  No edema  Plan: Connie Gentry is back in sinus rhythm.  She has no signs of heart failure on exam and no clear precipitants.  We will start metoprolol tartrate 12.5mg  bid and rivaroxaban 20mg  daily.  She will follow up for TTE tomorrow and see me in clinic in 2 weeks.

## 2015-08-08 NOTE — ED Notes (Signed)
Contacted x-ray - pt is now ready for x-ray.

## 2015-08-08 NOTE — ED Provider Notes (Addendum)
CSN: [id number]     Arrival date & time 08/08/15  0340 History  This chart was scribed for Tomasita Crumble, MD by Placido Sou, ED scribe. This patient was seen in room A12C/A12C and the patient's care was started at 3:41 AM.   Chief Complaint  Patient presents with  . Atrial Fibrillation   The history is provided by the patient and the EMS personnel. No language interpreter was used.    HPI Comments: Connie Gentry is a 49 y.o. female who presents to the Emergency Department by ambulance complaining of a rapid heart rate with onset PTA. Per EMS, pt woke up due to experiencing a rapid heart rate. EMS notes that she was between 150-180 BPM and after being give 20 mg of Cardizem, it was reduced to 130-160 BPM. Pt notes a Hx of thyroid disease and denies she has had any changes in her medication recently. She denies any Hx of atrial fibrillation. She denies any CP, SOB and leg swelling.   Past Medical History  Diagnosis Date  . Shingles 11-2013  . Allergy   . Hypertension   . Hyperlipidemia   . Thyroid disease     history of Graves' disease, status radioactive iodine 2003.  [name] menopause    Past Surgical History  Procedure Laterality Date  . Abdominal hysterectomy  2010    partial  . Oophorectomy  2010 at time of hysterectomy    side? L? R?   Family History  Problem Relation Age of Onset  . Hypertension Mother   . Hypertension Father   . Arthritis Maternal Grandmother   . Heart disease Maternal Grandmother   . Arthritis Maternal Grandfather   . Heart disease Maternal Grandfather   . Arthritis Paternal Grandmother   . Heart disease Paternal Grandmother   . Arthritis Paternal Grandfather   . Heart disease Paternal Grandfather   . Diabetes Other    Social History  Substance Use Topics  . Smoking status: Former Games developer  . Smokeless tobacco: Never Used  . Alcohol Use: 0.0 oz/week    0 Standard drinks or equivalent per week     Comment: wine    OB History    No data  available     Review of Systems A complete 10 system review of systems was obtained and all systems are negative except as noted in the HPI and PMH.   Allergies  Cephalexin and Vicodin  Home Medications   Prior to Admission medications   Medication Sig Start Date End Date Taking? Authorizing Provider  amoxicillin-clavulanate (AUGMENTIN) 875-125 MG per tablet Take 1 tablet by mouth 2 (two) times daily. 06/22/15   Ramon Dredge Saguier, PA-C  clobetasol ointment (TEMOVATE) 0.05 % Apply 1 application topically 2 (two) times daily. 03/30/15   Wanda Plump, MD  fluticasone [facility]) 50 MCG/ACT nasal spray Place 2 sprays into both nostrils daily. 06/14/15   Waldon Merl, PA-C  hydrochlorothiazide (HYDRODIURIL) 25 MG tablet Take 1 tablet (25 mg total) by mouth daily. 03/30/15   Wanda Plump, MD  levothyroxine (SYNTHROID, LEVOTHROID) 125 MCG tablet Take 1 tablet (125 mcg total) by mouth daily. 03/30/15   Wanda Plump, MD  methocarbamol (ROBAXIN) 500 MG tablet Take 1 tablet (500 mg total) by mouth at bedtime. 03/30/15   Wanda Plump, MD  Multiple Vitamins-Minerals (MULTIVITAMIN ADULT PO) Take 1 tablet by mouth daily. OTC    Historical Provider, MD  naproxen (NAPROSYN) 500 MG tablet Take 1 tablet (500 mg total)  by mouth 2 (two) times daily with a meal. 03/30/15 03/29/16  Wanda Plump, MD   There were no vitals taken for this visit. Physical Exam  Constitutional: She is oriented to person, place, and time. She appears well-developed and well-nourished. No distress.  HENT:  Head: Normocephalic and atraumatic.  Nose: Nose normal.  Mouth/Throat: Oropharynx is clear and moist. No oropharyngeal exudate.  Eyes: Conjunctivae and EOM are normal. Pupils are equal, round, and reactive to light. No scleral icterus.  Neck: Normal range of motion. Neck supple. No JVD present. No tracheal deviation present. No thyromegaly present.  Cardiovascular: Normal rate and normal heart sounds.  An irregularly irregular rhythm present. Exam  reveals no gallop and no friction rub.   No murmur heard. Pulmonary/Chest: Effort normal and breath sounds normal. No respiratory distress. She has no wheezes. She exhibits no tenderness.  Abdominal: Soft. Bowel sounds are normal. She exhibits no distension and no mass. There is no tenderness. There is no rebound and no guarding.  Musculoskeletal: Normal range of motion. She exhibits no edema or tenderness.  Lymphadenopathy:    She has no cervical adenopathy.  Neurological: She is alert and oriented to person, place, and time. No cranial nerve deficit. She exhibits normal muscle tone.  Skin: Skin is warm and dry. No rash noted. No erythema. No pallor.  Nursing note and vitals reviewed.   ED Course  Procedures  DIAGNOSTIC STUDIES: Oxygen Saturation is 99% on RA, normal by my interpretation.    COORDINATION OF CARE: 3:44 AM Discussed treatment plan with pt at bedside and pt agreed to plan.  Labs Review Labs Reviewed  CBC WITH DIFFERENTIAL/PLATELET - Abnormal; Notable for the following:    RBC 5.33 (*)    Hemoglobin 11.6 (*)    HCT 35.0 (*)    MCV 65.7 (*)    MCH 21.8 (*)    RDW 17.2 (*)    Eosinophils Relative 6 (*)    All other components within normal limits  BASIC METABOLIC PANEL - Abnormal; Notable for the following:    Glucose, Bld 106 (*)    All other components within normal limits  MAGNESIUM  ETHANOL  URINE RAPID DRUG SCREEN, HOSP PERFORMED  TSH  T4, FREE  I-STAT TROPOININ, ED  POC URINE PREG, ED    Imaging Review Dg Chest 2 View  08/08/2015   CLINICAL DATA:  Chest flutter. Atrial fibrillation, acute onset. Initial encounter.  EXAM: CHEST  2 VIEW  COMPARISON:  Chest radiograph performed 04/17/2013  FINDINGS: The lungs are well-aerated and clear. There is no evidence of focal opacification, pleural effusion or pneumothorax.  The heart is normal in size; the mediastinal contour is within normal limits. No acute osseous abnormalities are seen.  IMPRESSION: No acute  cardiopulmonary process seen.   Electronically Signed   By: Roanna Raider M.D.   On: 08/08/2015 05:58     EKG Interpretation   Date/Time:  Wednesday August 08 2015 03:49:37 EDT Ventricular Rate:  124 PR Interval:    QRS Duration: 89 QT Interval:  320 QTC Calculation: 460 R Axis:   44 Text Interpretation:  Atrial fibrillation Ventricular premature complex  Borderline repolarization abnormality Confirmed by Erroll Luna  [phone]) on 08/08/2015 4:04:58 AM      MDM   Final diagnoses:  None    Patient presents emergency department for palpitations. She was found to be in atrial fibrillation. We'll repeat diltiazem dose in the emergency department at 20 mg. Blood pressure remains steady  around 130/80. Patient was also given to grams of magnesium. Will obtain broad workup to discover the etiology of her atrial fibrillation including thyroid studies.  I spoke with cardiology will omit the patient for continued workup. Heart rate has come down to between 100-110.  HR now back up to 140.  Cardiology recs for a dose of flecainide to see if she cardioverts, then Dc home with eliquis.  Patient will be signed out to Dr. Clarene Duke for repeat evaluation.  If patient does not cardiovert by 8am, will call cards for admission.  CRITICAL CARE Performed by: Tomasita Crumble   Total critical care time:6min a-fib with RVR req diltiazem  x 2 and flecainide  Critical care time was exclusive of separately billable procedures and treating other patients.  Critical care was necessary to treat or prevent imminent or life-threatening deterioration.  Critical care was time spent personally by me on the following activities: development of treatment plan with patient and/or surrogate as well as nursing, discussions with consultants, evaluation of patient's response to treatment, examination of patient, obtaining history from patient or surrogate, ordering and performing treatments and interventions,  ordering and review of laboratory studies, ordering and review of radiographic studies, pulse oximetry and re-evaluation of patient's condition.   I personally performed the services described in this documentation, which was scribed in my presence. The recorded information has been reviewed and is accurate.   Tomasita Crumble, MD 08/08/15 1914  Tomasita Crumble, MD 08/08/15 7829

## 2015-08-08 NOTE — ED Notes (Signed)
Cardiologist at bedside.  

## 2015-08-08 NOTE — ED Provider Notes (Signed)
I received this patient in signout from Dr. Mariah Milling. Per his report, the patient presented with rapid heart rate that woke her from sleep earlier this morning. Patient demonstrated A. fib with RVR on EKG. Had received several medications in an attempt to convert and per cardiology recommendations, the plan was to admit if the patient did not convert. I called cardiology at 8 AM for reevaluation of patient's ongoing A. fib with RVR. At the time of their evaluation, the patient had converted to sinus rhythm and has remained in sinus rhythm here in the emergency department. They have scheduled her for an outpatient echo and follow-up tomorrow morning in the clinic and have provided her information for follow-up. Per their recommendation, I have given the patient a prescription for Xarelto and metoprolol and pharmacy has educated the patient on anticoagulant use. They have also provided her with vouchers for the first 30 days of use. Return precautions reviewed. Patient discharged in satisfactory condition.  Laurence Spates, MD 08/08/15 1051

## 2015-08-08 NOTE — ED Notes (Signed)
Per EMS - pt awakened from sleep d/t heart racing and chest tightness. A Fib on monitor - rate 150-180bpm. Given 20 of cardizem, hr down to 130-160bpm. 12-lead shows no elevation/depression. Denies hx of A Fib. 22G Left hand. 100% ra, bp 145/98. Hx htn, thyroid issues.

## 2015-08-09 ENCOUNTER — Other Ambulatory Visit: Payer: Self-pay

## 2015-08-09 ENCOUNTER — Ambulatory Visit (HOSPITAL_COMMUNITY): Payer: 59 | Attending: Internal Medicine

## 2015-08-09 DIAGNOSIS — Z87891 Personal history of nicotine dependence: Secondary | ICD-10-CM | POA: Insufficient documentation

## 2015-08-09 DIAGNOSIS — E669 Obesity, unspecified: Secondary | ICD-10-CM | POA: Insufficient documentation

## 2015-08-09 DIAGNOSIS — I340 Nonrheumatic mitral (valve) insufficiency: Secondary | ICD-10-CM | POA: Diagnosis not present

## 2015-08-09 DIAGNOSIS — E785 Hyperlipidemia, unspecified: Secondary | ICD-10-CM | POA: Insufficient documentation

## 2015-08-09 DIAGNOSIS — I480 Paroxysmal atrial fibrillation: Secondary | ICD-10-CM | POA: Diagnosis not present

## 2015-08-09 DIAGNOSIS — I10 Essential (primary) hypertension: Secondary | ICD-10-CM | POA: Insufficient documentation

## 2015-08-09 DIAGNOSIS — I5189 Other ill-defined heart diseases: Secondary | ICD-10-CM | POA: Insufficient documentation

## 2015-08-09 DIAGNOSIS — I4891 Unspecified atrial fibrillation: Secondary | ICD-10-CM | POA: Diagnosis present

## 2015-08-13 ENCOUNTER — Telehealth: Payer: Self-pay | Admitting: *Deleted

## 2015-08-13 NOTE — Telephone Encounter (Signed)
Attempted the work number several times not working properly [employer] patient at home. Results given to patient.she verbalized understanding.

## 2015-08-13 NOTE — Telephone Encounter (Signed)
Pt called in stating that she spoke with you earlier today in regards to her ultrasound results. She stated that she was in somewhat of a "daze" and wasn't quite paying attention to what Jasmine December was saying. Please call her back with those results once again if you could   Thanks

## 2015-08-13 NOTE — Telephone Encounter (Signed)
Spoke to patient. Result given . Verbalized understanding  

## 2015-08-13 NOTE — Telephone Encounter (Signed)
-----   Message from Chilton Si, MD sent at 08/09/2015  1:35 PM EDT ----- Echo shows that her heart does not relax as well as it should, but this is a mild abnormality.  There is no explanation on the echo for her atrial fibrillation.  We will discuss this further at her upcoming appointment.

## 2015-08-13 NOTE — Telephone Encounter (Signed)
Left message to call back  In regards to echo results

## 2015-08-22 ENCOUNTER — Telehealth: Payer: 59 | Admitting: Family

## 2015-08-22 ENCOUNTER — Ambulatory Visit: Payer: 59 | Admitting: Internal Medicine

## 2015-08-22 DIAGNOSIS — J069 Acute upper respiratory infection, unspecified: Secondary | ICD-10-CM

## 2015-08-22 MED ORDER — BENZONATATE 200 MG PO CAPS: 200.0000 mg | ORAL_CAPSULE | Freq: Two times a day (BID) | ORAL | Status: DC | PRN

## 2015-08-22 NOTE — Progress Notes (Signed)

## 2015-08-30 ENCOUNTER — Encounter: Payer: Self-pay | Admitting: Cardiovascular Disease

## 2015-08-30 ENCOUNTER — Ambulatory Visit (INDEPENDENT_AMBULATORY_CARE_PROVIDER_SITE_OTHER): Payer: 59 | Admitting: Cardiovascular Disease

## 2015-08-30 VITALS — BP 116/80 | HR 59 | Ht 65.0 in | Wt 202.0 lb

## 2015-08-30 DIAGNOSIS — I480 Paroxysmal atrial fibrillation: Secondary | ICD-10-CM

## 2015-08-30 DIAGNOSIS — I4891 Unspecified atrial fibrillation: Secondary | ICD-10-CM

## 2015-08-30 MED ORDER — METOPROLOL TARTRATE 25 MG PO TABS: 12.5000 mg | ORAL_TABLET | Freq: Two times a day (BID) | ORAL | Status: DC

## 2015-08-30 MED ORDER — RIVAROXABAN 20 MG PO TABS: 20.0000 mg | ORAL_TABLET | Freq: Every day | ORAL | Status: DC

## 2015-08-30 NOTE — Progress Notes (Signed)
-   Cardiology Office Note   Date:  08/30/2015   ID:  Connie Gentry, DOB September 20, 1966, MRN [id number]  PCP:  Willow Ora, MD  Cardiologist:   Madilyn Hook, MD   Chief Complaint  Patient presents with  . Atrial Fibrillation    pt was in hospital 2 weeks ago, pt woke up with fast heart rate/palpitations      History of Present Illness: Connie Gentry is a 49 y.o. female with hypertension who presents for follow up on newly diagnosed atrial fibrillation.   Connie Gentry presented to the [facility] emergency department on August 10 with atrial fibrillation with RVR. Upon arrival her heart rate was in the 170s. She was initially attempted to be rate controlled with diltiazem but failed. She ultimately was pharmacologically cardioverted with one dose of flecainide. She spontaneously converted to sinus rhythm and has been in sinus rhythm ever since. She was discharged on metoprolol and Xarelto. This Megill has been feeling well since discharge. She denies any further episodes of atrial fibrillation. She denies any chest pain, shortness of breath, orthopnea, PND, lightheadedness or dizziness. She had an echo that showed grade 1 diastolic dysfunction but was otherwise unremarkable. Her thyroid function and electrolytes are also within normal limits.  Connie Gentry has been going through a lot of stress recently she is going through a divorce with her husband of many years. She also recently had to put down her dog. She's been trying to keep occupied by exercising. Prior to this episode she had joined a gym and enjoyed exercising most days of the week. She also had started the green smoothie cleanse. With these efforts she had lost 26 pounds since January. She wonders if it is okay to resume these activities.    Past Medical History  Diagnosis Date  . Shingles 11-2013  . Allergy   . Hypertension   . Hyperlipidemia   . Thyroid disease     history of Graves' disease, status radioactive iodine  2003.  Connie Gentry menopause     Past Surgical History  Procedure Laterality Date  . Abdominal hysterectomy  2010    partial  . Oophorectomy  2010 at time of hysterectomy    side? L? R?     Current Outpatient Prescriptions  Medication Sig Dispense Refill  . benzonatate (TESSALON) 200 MG capsule Take 1 capsule (200 mg total) by mouth 2 (two) times daily as needed for cough. 20 capsule 0  . cetirizine (ZYRTEC) 10 MG tablet Take 10 mg by mouth daily.    . clobetasol ointment (TEMOVATE) 0.05 % Apply 1 application topically 2 (two) times daily. (Patient taking differently: Apply 1 application topically 2 (two) times daily as needed (rash). ) 30 g 0  . fluticasone (FLONASE) 50 MCG/ACT nasal spray Place 2 sprays into both nostrils daily. (Patient taking differently: Place 2 sprays into both nostrils daily as needed for allergies. ) 16 g 6  . hydrochlorothiazide (HYDRODIURIL) 25 MG tablet Take 1 tablet (25 mg total) by mouth daily. 90 tablet 1  . levothyroxine (SYNTHROID, LEVOTHROID) 125 MCG tablet Take 1 tablet (125 mcg total) by mouth daily. 90 tablet 1  . metoprolol tartrate (LOPRESSOR) 25 MG tablet Take 0.5 tablets (12.5 mg total) by mouth 2 (two) times daily. 30 tablet 6  . Multiple Vitamins-Minerals (MULTIVITAMIN ADULT PO) Take 1 tablet by mouth daily. OTC    . rivaroxaban (XARELTO) 20 MG TABS tablet Take 1 tablet (20 mg total) by mouth daily  with supper. 30 tablet 6  . [DISCONTINUED] apixaban (ELIQUIS) 5 MG TABS tablet Take 1 tablet (5 mg total) by mouth 2 (two) times daily. 60 tablet 0   No current facility-administered medications for this visit.    Allergies:   Cephalexin and Vicodin    Social History:  The patient  reports that she has quit smoking. She has never used smokeless tobacco. She reports that she drinks alcohol. She reports that she does not use illicit drugs.   Family History:  The patient's family history includes Arthritis in her maternal grandfather, maternal  grandmother, paternal grandfather, and paternal grandmother; Diabetes in her other; Heart disease in her maternal grandfather, maternal grandmother, paternal grandfather, and paternal grandmother; Hypertension in her father and mother.    ROS:  Please see the history of present illness.   Otherwise, review of systems are positive for none.   All other systems are reviewed and negative.    PHYSICAL EXAM: VS:  BP 116/80 mmHg  Pulse 59  Ht 5\' 5"  (1.651 m)  Wt 91.627 kg (202 lb)  BMI 33.61 kg/m2  LMP 06/08/2015 , BMI Body mass index is 33.61 kg/(m^2). GENERAL:  Well appearing HEENT:  Pupils equal round and reactive, fundi not visualized, oral mucosa unremarkable NECK:  No jugular venous distention, waveform within normal limits, carotid upstroke brisk and symmetric, no bruits, no thyromegaly LYMPHATICS:  No cervical adenopathy LUNGS:  Clear to auscultation bilaterally HEART:  RRR.  PMI not displaced or sustained,S1 and S2 within normal limits, no S3, no S4, no clicks, no rubs, no murmurs ABD:  Flat, positive bowel sounds normal in frequency in pitch, no bruits, no rebound, no guarding, no midline pulsatile mass, no hepatomegaly, no splenomegaly EXT:  2 plus pulses throughout, no edema, no cyanosis no clubbing SKIN:  No rashes no nodules NEURO:  Cranial nerves II through XII grossly intact, motor grossly intact throughout PSYCH:  Cognitively intact, oriented to person place and time    EKG:  EKG is ordered today. The ekg ordered today demonstrates sinus bradycardia at 59 bpm.   Recent Labs: 03/30/2015: ALT 24 08/08/2015: BUN 11; Creatinine, Ser 0.74; Hemoglobin 11.6*; Magnesium 1.9; Platelets 240; Potassium 3.6; Sodium 140; TSH 2.136    Lipid Panel    Component Value Date/Time   CHOL 227* 08/01/2013 1416   TRIG 83 08/01/2013 1416   HDL 56 08/01/2013 1416   CHOLHDL 4.1 08/01/2013 1416   VLDL 17 08/01/2013 1416   LDLCALC 154* 08/01/2013 1416   LDLDIRECT 137.2 06/09/2012 0959        Wt Readings from Last 3 Encounters:  08/30/15 91.627 kg (202 lb)  06/22/15 91.808 kg (202 lb 6.4 oz)  03/30/15 96.673 kg (213 lb 2 oz)      Other studies Reviewed: Additional studies/ records that were reviewed today include: . Review of the above records demonstrates:  Please see elsewhere in the note.    ASSESSMENT AND PLAN:  # Atrial fibrillation: Connie Gentry has not had any further episodes of atrial fibrillation.  No cause was identified for her atrial fibrillation, as she has normal thyroid function, does not drink heavily, and has no significant valvular abnormalities. Se has tolerated metoprolol and her alto well. We will not make any changes to her regimen at this time.   This patients CHA2DS2-VASc Score and unadjusted Ischemic Stroke Rate (% per year) is equal to 0.6 % stroke rate/year from a score of 1 Above score calculated as 1 point each if present [  CHF, HTN, DM, Vascular=MI/PAD/Aortic Plaque, Age if 65-74, or Female] Above score calculated as 2 points each if present [Age > 75, or Stroke/TIA/TE]  # Obesity: Connie Gentry was encouraged to resume her exercise activity and dieting.  She was congratulated on her weight loss.  Atrophic fibrillation should not stop these efforts.  # Hypertension: Connie Gentry blood pressure is well controlled today. We will not make any changes to her hydrochlorothiazide and metoprolol.  Current medicines are reviewed at length with the patient today.  The patient does not have concerns regarding medicines.  The following changes have been made:  no change  Labs/ tests ordered today include:   Orders Placed This Encounter  Procedures  . EKG 12-Lead     Disposition:   FU with Dr. Elmarie Shiley C. Warren in 6 months.  We will check lipids at that appointment.    Signed, Madilyn Hook, MD  08/30/2015 10:18 AM    [facility]

## 2015-08-30 NOTE — Patient Instructions (Addendum)
Your physician wants you to follow-up in: 6 months or sooner if needed with Dr. Duke Salvia. You will receive a reminder letter in the mail two months in advance. If you don't receive a letter, please call our office to schedule the follow-up appointment. No changes were made today in your therapy.  Your xarelto and metoprolol has been refilled and sent in to [facility] long outpatient pharmacy.

## 2015-09-03 ENCOUNTER — Telehealth: Payer: 59 | Admitting: Family

## 2015-09-03 DIAGNOSIS — J069 Acute upper respiratory infection, unspecified: Secondary | ICD-10-CM | POA: Diagnosis not present

## 2015-09-03 MED ORDER — BENZONATATE 200 MG PO CAPS: 200.0000 mg | ORAL_CAPSULE | Freq: Two times a day (BID) | ORAL | Status: DC | PRN

## 2015-09-03 MED ORDER — AZITHROMYCIN 250 MG PO TABS: ORAL_TABLET | ORAL | Status: DC

## 2015-09-03 NOTE — Progress Notes (Signed)

## 2015-09-25 ENCOUNTER — Other Ambulatory Visit: Payer: Self-pay | Admitting: Internal Medicine

## 2015-09-28 ENCOUNTER — Telehealth: Payer: Self-pay | Admitting: Internal Medicine

## 2015-09-28 NOTE — Telephone Encounter (Signed)
pre visit letter mailed 09/28/15 °

## 2015-10-07 ENCOUNTER — Telehealth: Payer: 59 | Admitting: Family

## 2015-10-07 DIAGNOSIS — N760 Acute vaginitis: Secondary | ICD-10-CM

## 2015-10-07 DIAGNOSIS — B9689 Other specified bacterial agents as the cause of diseases classified elsewhere: Secondary | ICD-10-CM

## 2015-10-07 DIAGNOSIS — A499 Bacterial infection, unspecified: Secondary | ICD-10-CM | POA: Diagnosis not present

## 2015-10-07 MED ORDER — FLUCONAZOLE 150 MG PO TABS: 150.0000 mg | ORAL_TABLET | Freq: Once | ORAL | Status: DC

## 2015-10-07 MED ORDER — METRONIDAZOLE 500 MG PO TABS: 500.0000 mg | ORAL_TABLET | Freq: Two times a day (BID) | ORAL | Status: DC

## 2015-10-07 NOTE — Progress Notes (Signed)
We are sorry that you are not feeling well. Here is how we plan to help! Based on what you shared with me it looks like you: May have a vaginosis due to bacteria  Vaginosis is an inflammation of the vagina that can result in discharge, itching and pain. The cause is usually a change in the normal balance of vaginal bacteria or an infection. Vaginosis can also result from reduced estrogen levels after menopause.  The most common causes of vaginosis are:   Bacterial vaginosis which results from an overgrowth of one on several organisms that are normally present in your vagina.   Yeast infections which are caused by a naturally occurring fungus called candida.   Vaginal atrophy (atrophic vaginosis) which results from the thinning of the vagina from reduced estrogen levels after menopause.   Trichomoniasis which is caused by a parasite and is commonly transmitted by sexual intercourse.  Factors that increase your risk of developing vaginosis include: Marland Kitchen Medications, such as antibiotics and steroids . Uncontrolled diabetes . Use of hygiene products such as bubble bath, vaginal spray or vaginal deodorant . Douching . Wearing damp or tight-fitting clothing . Using an intrauterine device (IUD) for birth control . Hormonal changes, such as those associated with pregnancy, birth control pills or menopause . Sexual activity . Having a sexually transmitted infection  Your treatment plan is Metronidazole or Flagyl  twice a day for 7 days.  I have electronically sent this prescription into the pharmacy that you have chosen.   I have also prescribed Diflucan  once. Since you recently had antibiotics and I am giving you more, your odds for a yeast infection are high. I do not believe you have one at this time as you likely have a bacterial infection as indicated above. However, if you do develop white discharge and yeat-infection like symptoms, you do have this medication prescribed from [place].    Be sure to take all of the medication as directed. Stop taking any medication if you develop a rash, tongue swelling or shortness of breath. Mothers who are breast feeding should consider pumping and discarding their breast milk while on these antibiotics. However, there is no consensus that infant exposure at these doses would be harmful.  Remember that medication creams can weaken latex condoms. Marland Kitchen   HOME CARE:  Good hygiene may prevent some types of vaginosis from recurring and may relieve some symptoms:  . Avoid baths, hot tubs and whirlpool spas. Rinse soap from your outer genital area after a shower, and dry the area well to prevent irritation. Don't use scented or harsh soaps, such as those with deodorant or antibacterial action. Marland Kitchen Avoid irritants. These include scented tampons and pads. . Wipe from front to back after using the toilet. Doing so avoids spreading fecal bacteria to your vagina.  Other things that may help prevent vaginosis include:  Marland Kitchen Don't douche. Your vagina doesn't require cleansing other than normal bathing. Repetitive douching disrupts the normal organisms that reside in the vagina and can actually increase your risk of vaginal infection. Douching won't clear up a vaginal infection. . Use a latex condom. Both female and female latex condoms may help you avoid infections spread by sexual contact. . Wear cotton underwear. Also wear pantyhose with a cotton crotch. If you feel comfortable without it, skip wearing underwear to bed. Yeast thrives in [employer] Your symptoms should improve in the next day or two.  GET HELP RIGHT AWAY IF:  . You have pain  in your lower abdomen ( pelvic area or over your ovaries) . You develop nausea or vomiting . You develop a fever . Your discharge changes or worsens . You have persistent pain with intercourse . You develop shortness of breath, a rapid pulse, or you faint.  These symptoms could be signs of problems or  infections that need to be evaluated by a medical provider now.  MAKE SURE YOU    Understand these instructions.  Will watch your condition.  Will get help right away if you are not doing well or get worse.  Your e-visit answers were reviewed by a board certified advanced clinical practitioner to complete your personal care plan. Depending upon the condition, your plan could have included both over the counter or prescription medications. Please review your pharmacy choice to make sure that you have choses a pharmacy that is open for you to pick up any needed prescription, Your safety is important to [place]. If you have drug allergies check your prescription carefully.   You can use MyChart to ask questions about today's visit, request a non-urgent call back, or ask for a work or school excuse for 24 hours related to this e-Visit. If it has been greater than 24 hours you will need to follow up with your provider, or enter a new e-Visit to address those concerns. You will get a MyChart message within the next two days asking about your experience. I hope that your e-visit has been valuable and will speed your recovery.

## 2015-10-19 ENCOUNTER — Encounter: Payer: 59 | Admitting: Internal Medicine

## 2015-10-26 ENCOUNTER — Telehealth: Payer: Self-pay | Admitting: Cardiovascular Disease

## 2015-10-26 NOTE — Telephone Encounter (Signed)
Patient felt she went into AFIB last night due to HR in high 70-80. Stated normally high 60's.  Today she said it is still lowering but would feel better if someone could speak to her about what she should expect

## 2015-10-26 NOTE — Telephone Encounter (Signed)
Returned call to patient.She stated she felt more tired than normally yesterday afternoon while at work.She checked B/P with automatic machine B/P 124/76 pulse regular 90.No chest pain.No palpitations.When she left work she went to her 2nd job and felt ok just alittle more tired.Today at work she still feels tired B/P 122/77 pulse 73 to 75 regular.No chest pain.No palpitations.Patient reassured.Advised she may be working too much.Advised to continue all medications.Advised to call back if needed.

## 2015-10-29 ENCOUNTER — Other Ambulatory Visit: Payer: Self-pay | Admitting: Internal Medicine

## 2015-11-06 ENCOUNTER — Other Ambulatory Visit: Payer: Self-pay

## 2015-11-06 MED ORDER — LEVOTHYROXINE SODIUM 125 MCG PO TABS: 125.0000 ug | ORAL_TABLET | Freq: Every day | ORAL | Status: DC

## 2015-12-07 ENCOUNTER — Telehealth: Payer: Self-pay | Admitting: Cardiovascular Disease

## 2015-12-07 NOTE — Telephone Encounter (Signed)
Pt questions addressed to satisfaction.

## 2015-12-07 NOTE — Telephone Encounter (Signed)
Please call,question about medicine and alcohol.

## 2015-12-20 ENCOUNTER — Other Ambulatory Visit: Payer: Self-pay | Admitting: Internal Medicine

## 2015-12-26 ENCOUNTER — Other Ambulatory Visit: Payer: Self-pay

## 2016-01-01 ENCOUNTER — Encounter: Payer: Self-pay | Admitting: Internal Medicine

## 2016-01-01 ENCOUNTER — Ambulatory Visit (INDEPENDENT_AMBULATORY_CARE_PROVIDER_SITE_OTHER): Payer: 59 | Admitting: Internal Medicine

## 2016-01-01 VITALS — BP 124/68 | HR 72 | Temp 97.9°F | Ht 65.0 in | Wt 215.1 lb

## 2016-01-01 DIAGNOSIS — Z0000 Encounter for general adult medical examination without abnormal findings: Secondary | ICD-10-CM | POA: Diagnosis not present

## 2016-01-01 MED ORDER — ESCITALOPRAM OXALATE 5 MG PO TABS: 5.0000 mg | ORAL_TABLET | Freq: Every day | ORAL | Status: DC

## 2016-01-01 MED FILL — ESCITALOPRAM 5 MG TABLET: 5 | 30 days supply | Qty: 30 | Fill #0 | Status: TO

## 2016-01-01 NOTE — Patient Instructions (Addendum)
Please do the following labs at [facility] and fax me the report: CMP FLP CBC, iron, ferritin TSH   Next visit in 2- 3 months, please make an appointment

## 2016-01-01 NOTE — Progress Notes (Signed)
Subjective:    Patient ID: Connie Gentry, female    DOB: [birth year], 50 y.o.   MRN: [id number]  DOS:  01/01/2016 Type of visit - description : CPX Interval history: Since the last time she was here, she was diagnosed with atrial fibrillation. Currently asymptomatic from the cardiovascular standpoint however the diagnoses has make her very anxious, denies depression; from time to time she gets dizzy and has a "tingling all over" when she gets anxious. She is afraid to exercise because of h/o  atrial fibrillation.    Review of Systems  Constitutional: No fever. No chills. No unexplained wt changes. No unusual sweats  HEENT: No dental problems, no ear discharge, no facial swelling, no voice changes. No eye discharge, no eye  redness , no  intolerance to light   Respiratory: No wheezing , no  difficulty breathing. No cough , no mucus production  Cardiovascular: No CP, no leg swelling , no  Palpitations  GI: no nausea, no vomiting, no diarrhea , no  abdominal pain.  No blood in the stools. No dysphagia, no odynophagia    Endocrine: No polyphagia, no polyuria , no polydipsia  GU: No dysuria, gross hematuria, difficulty urinating. No urinary urgency, no frequency.  Musculoskeletal: No joint swellings or unusual aches or pains  Skin: No change in the color of the skin, palor , no  Rash  Allergic, immunologic: No environmental allergies , no  food allergies  Neurological:  no  syncope. No headaches. No diplopia, no slurred, no slurred speech, no motor deficits, no facial  Numbness  Hematological: No enlarged lymph nodes, no easy bruising , no unusual bleedings  Psychiatry: No suicidal ideas, no hallucinations, no beavior problems, no confusion.     Past Medical History  Diagnosis Date  . Shingles 11-2013  . Allergy   . Hypertension   . Hyperlipidemia   . Thyroid disease     history of Graves' disease, status radioactive iodine 2003.  [name] menopause     Past  Surgical History  Procedure Laterality Date  . Abdominal hysterectomy  2010    partial  . Oophorectomy  2010 at time of hysterectomy    side? L? R?    Social History   Social History  . Marital Status: Married    Spouse Name: N/A  . Number of Children: 2  . Years of Education: 2 year   Occupational History  . Admissions, CNA by trainning     Social History Main Topics  . Smoking status: Former Games developer  . Smokeless tobacco: Never Used  . Alcohol Use: 0.0 oz/week    0 Standard drinks or equivalent per week     Comment: wine   . Drug Use: No  . Sexual Activity: Not on file   Other Topics Concern  . Not on file   Social History Narrative    Originally from [place], moved to [place] and 2004 to the [place] area   divorced as of 12-2015     Family History  Problem Relation Age of Onset  . Hypertension Mother   . Hypertension Father   . Arthritis Maternal Grandmother   . Heart disease Maternal Grandmother   . Arthritis Maternal Grandfather   . Heart disease Maternal Grandfather   . Arthritis Paternal Grandmother   . Heart disease Paternal Grandmother   . Arthritis Paternal Grandfather   . Heart disease Paternal Grandfather   . Diabetes Other   . Colon cancer Neg Hx   .  Breast cancer Neg Hx        Medication List       This list is accurate as of: 01/01/16 11:59 PM.  Always use your most recent med list.               cetirizine 10 MG tablet  Commonly known as:  ZYRTEC  Take 10 mg by mouth daily.     clobetasol ointment 0.05 %  Commonly known as:  TEMOVATE  Apply 1 application topically 2 (two) times daily.     escitalopram 5 MG tablet  Commonly known as:  LEXAPRO  Take 1 tablet (5 mg total) by mouth daily.     hydrochlorothiazide 25 MG tablet  Commonly known as:  HYDRODIURIL  Take 1 tablet (25 mg total) by mouth daily.     levothyroxine 125 MCG tablet  Commonly known as:  SYNTHROID, LEVOTHROID  TAKE 1 TABLET BY MOUTH DAILY BEFORE BREAKFAST.      metoprolol tartrate 25 MG tablet  Commonly known as:  LOPRESSOR  Take 0.5 tablets (12.5 mg total) by mouth 2 (two) times daily.     MULTIVITAMIN ADULT PO  Take 1 tablet by mouth daily. OTC     rivaroxaban 20 MG Tabs tablet  Commonly known as:  XARELTO  Take 1 tablet (20 mg total) by mouth daily with supper.           Objective:   Physical Exam BP 124/68 mmHg  Pulse 72  Temp(Src) 97.9 F (36.6 C) (Oral)  Ht 5\' 5"  (1.651 m)  Wt 215 lb 2 oz (97.58 kg)  BMI 35.80 kg/m2  SpO2 99%  LMP 06/08/2015 General:   Well developed, well nourished . NAD.  HEENT:  Normocephalic . Face symmetric, atraumatic Lungs:  CTA B Normal respiratory effort, no intercostal retractions, no accessory muscle use. Heart: RRR,  no murmur.  no pretibial edema bilaterally  Abdomen:  Not distended, soft, non-tender. No rebound or rigidity. No mass,organomegaly Skin: Not pale. Not jaundice Neurologic:  alert & oriented X3.  Speech normal, gait appropriate for age and unassisted Psych--  Cognition and judgment appear intact.  Cooperative with normal attention span and concentration.  Behavior appropriate. Slightly anxious and apprehensive appearing but no depressed appearing.    Assessment & Plan:   Assessment HTN Hyperlipidemia Atrial fibrillation DX 07-2015,Seen at the ER w/ RVR, converted, episodes 1 , on BB-xarelto, saw Dr Duke Salviaandolph 08-2015  Graves' disease S/P radioactive iodine 2003 Early menopause Shingles 11-2013  PLAN:  HTN: Now on beta blockers, BP excellent. Hyperlipidemia: Check labs Atrial fibrillation: Single episode, on beta blockers and Xarelto, to see cardiology in 2-3 months. Anxiety: sx since she was diagnosed with atrial fibrillation, patient is counseled to the best of my ability; I do believe she needs help, she agrees, likes a very low dose; RX Lexapro 5 mg daily. Will come back in 3 months for reassessment, call sooner if she realizes needs something different. RTC 2-3  months

## 2016-01-01 NOTE — Progress Notes (Signed)
Pre visit review using our clinic review tool, if applicable. No additional management support is needed unless otherwise documented below in the visit note. 

## 2016-01-01 NOTE — Assessment & Plan Note (Addendum)
Td ~2008 Had a flu shot  09-2015 @ work [facility]: Needs had a colonoscopy, few months ago was diagnosed with afibrillation, on Xarelto. Recommend to wait a few more months,is possible Xarelto will be discontinue, then we'll proceed with a GI referral.  she is in agreement  [facility] , last visit 2016 Last mammogram  2015    Diet and exercise discussed

## 2016-01-02 MED FILL — XARELTO 20 MG TABLET: 20 | 30 days supply | Qty: 30 | Fill #4

## 2016-01-09 ENCOUNTER — Encounter: Payer: Self-pay | Admitting: Internal Medicine

## 2016-01-28 MED FILL — XARELTO 20 MG TABLET: 20 | 30 days supply | Qty: 30 | Fill #5

## 2016-01-28 MED FILL — HYDROCHLOROTHIAZIDE 25 MG T: 25 | 90 days supply | Qty: 90 | Fill #1 | Status: TO

## 2016-01-28 MED FILL — METOPROLOL TARTRATE 25 MG T: 25 | 30 days supply | Qty: 30 | Fill #5

## 2016-01-30 DIAGNOSIS — H524 Presbyopia: Secondary | ICD-10-CM | POA: Diagnosis not present

## 2016-02-02 IMAGING — CR DG FOOT COMPLETE 3+V*L*
3 series · 3 of 3 positions shown · non-contrast
Comparison: July 19, 2013.

CLINICAL DATA: Acute bilateral foot pain after moving furniture 4
days ago. Initial encounter.

EXAM:
LEFT FOOT - COMPLETE 3+ VIEW

[t foot ap left]
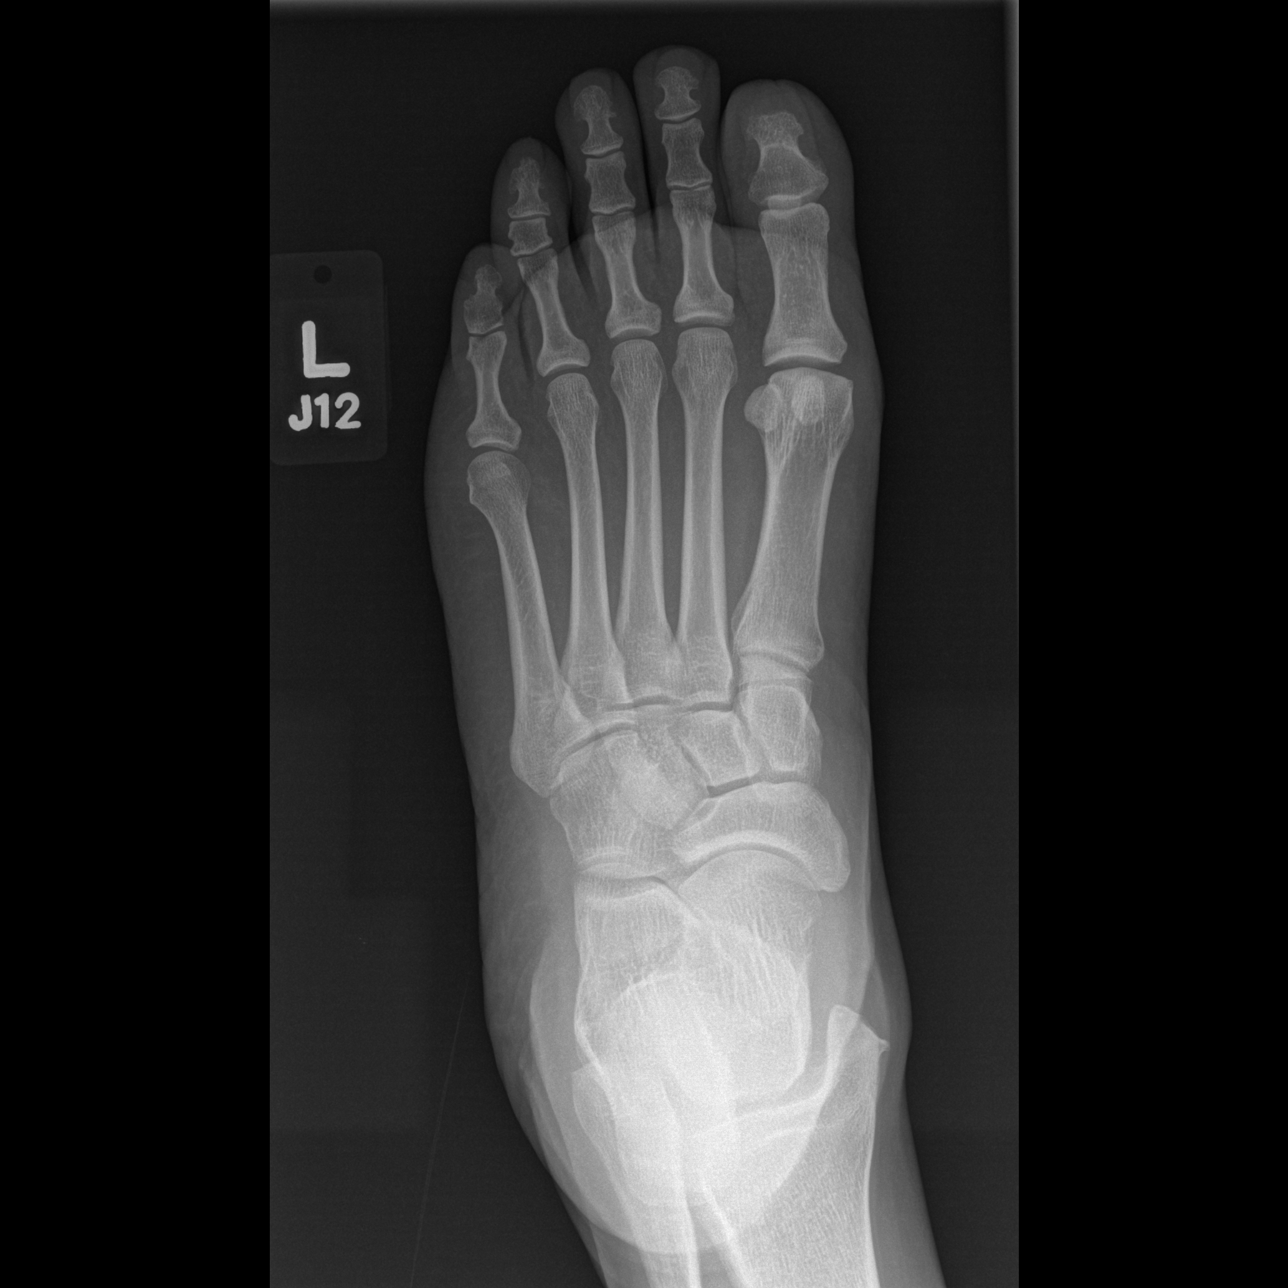

[t foot oblique left]
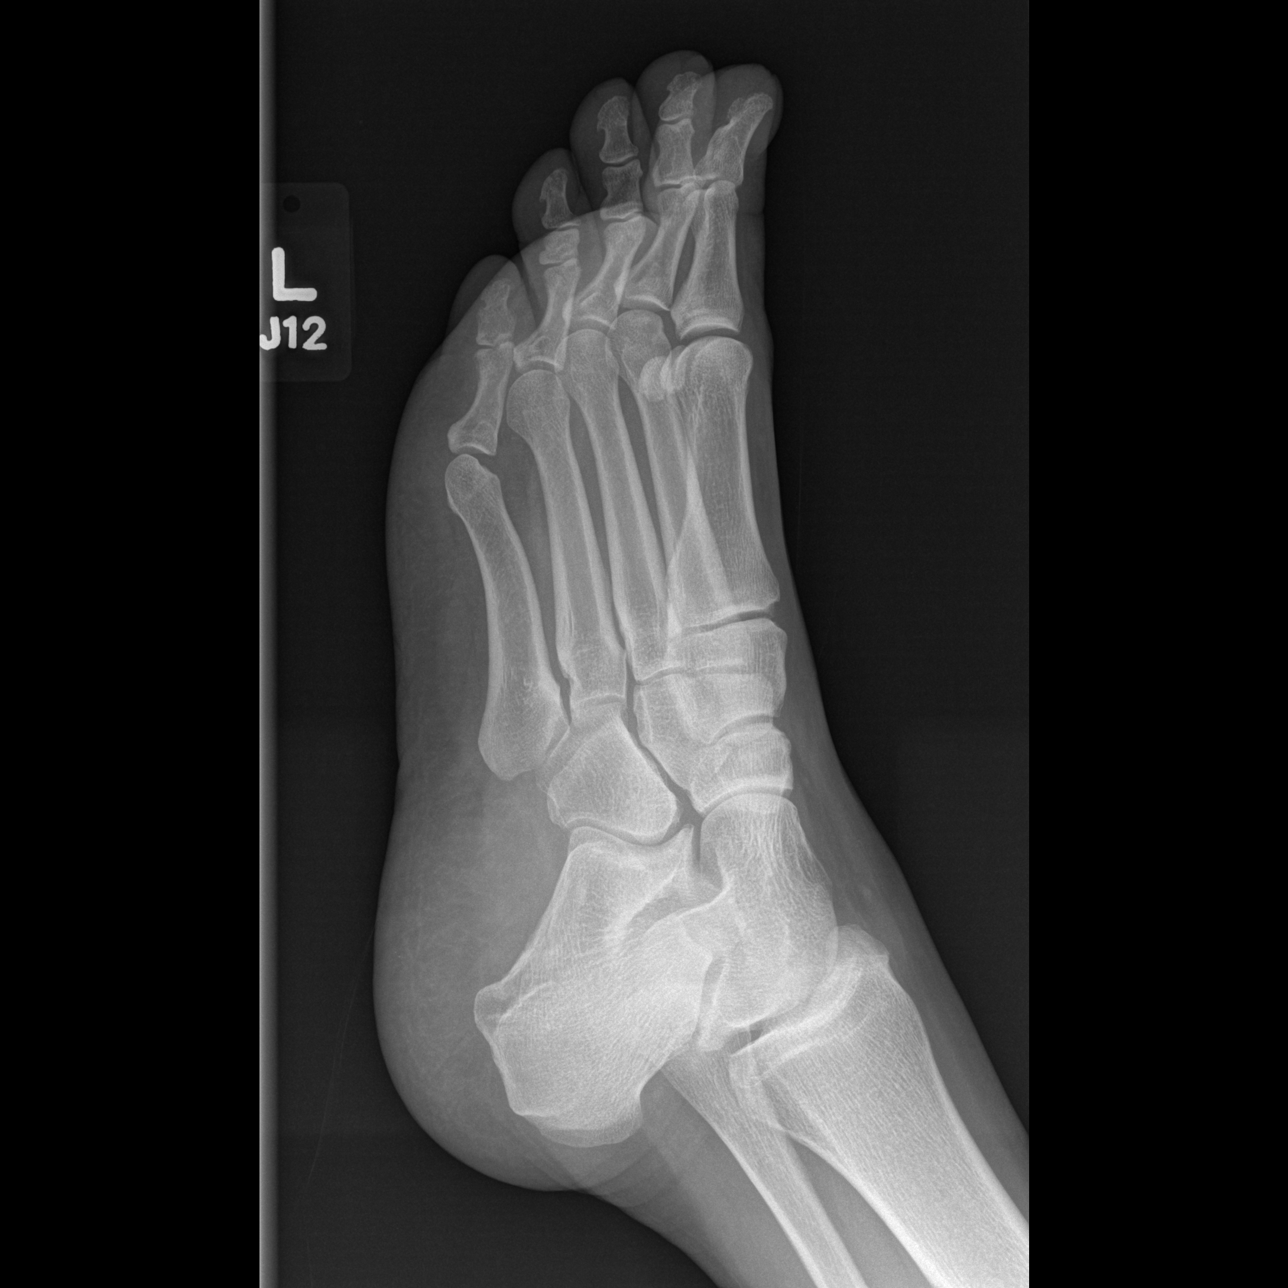

[t foot lat left]
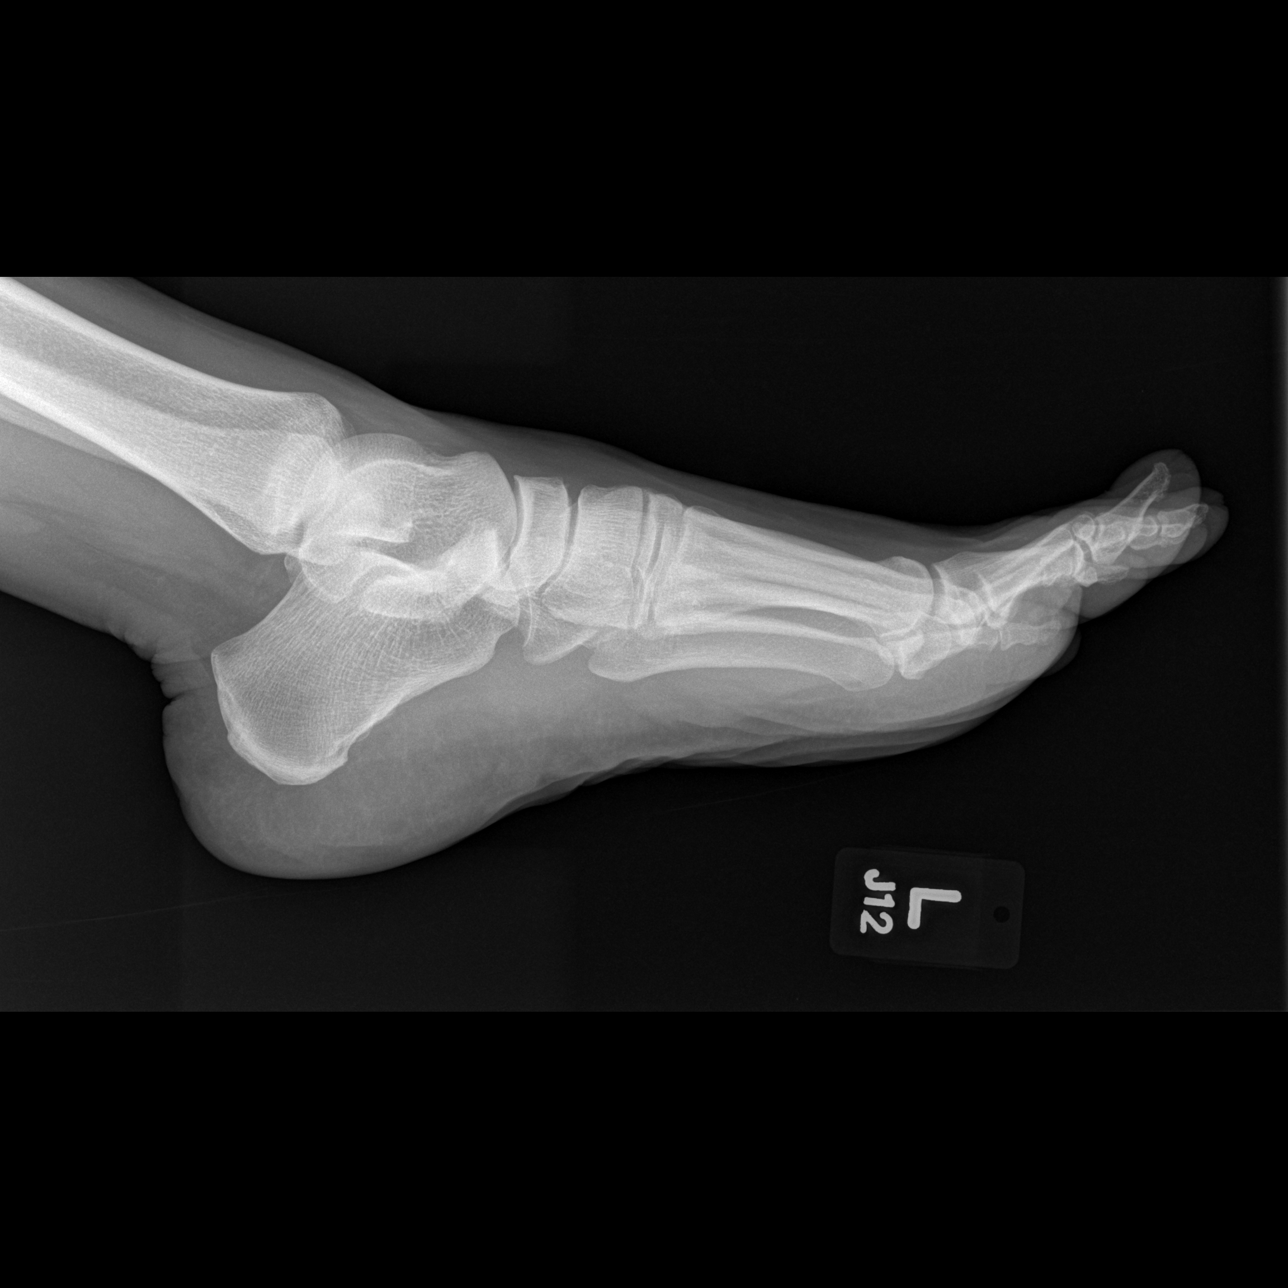

[3 of 3 positions shown; findings below may reference images not displayed]

FINDINGS: There is no evidence of fracture or dislocation. There is no
evidence of arthropathy or other focal bone abnormality. Soft
tissues are unremarkable.
IMPRESSION: Normal left foot.

## 2016-02-02 IMAGING — CR DG FOOT COMPLETE 3+V*R*
3 series · 3 of 3 positions shown · non-contrast
Comparison: None.

CLINICAL DATA: Foot pain after moving furniture

EXAM:
RIGHT FOOT COMPLETE - 3+ VIEW

[t foot ap right]
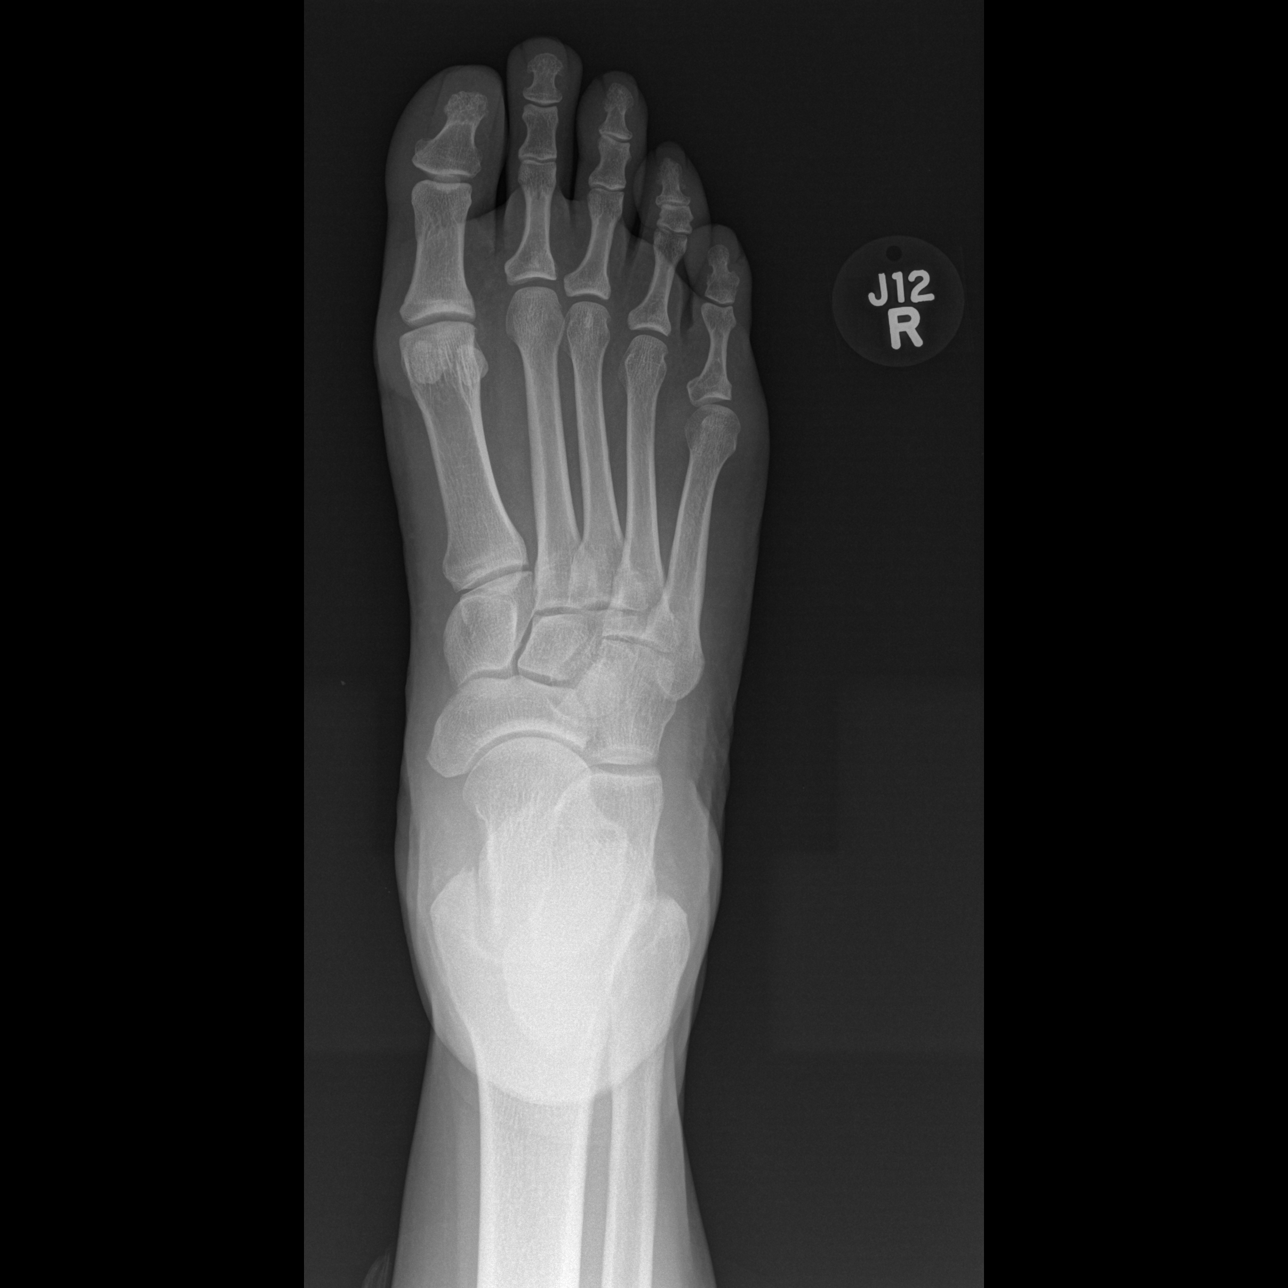

[t foot oblique right]
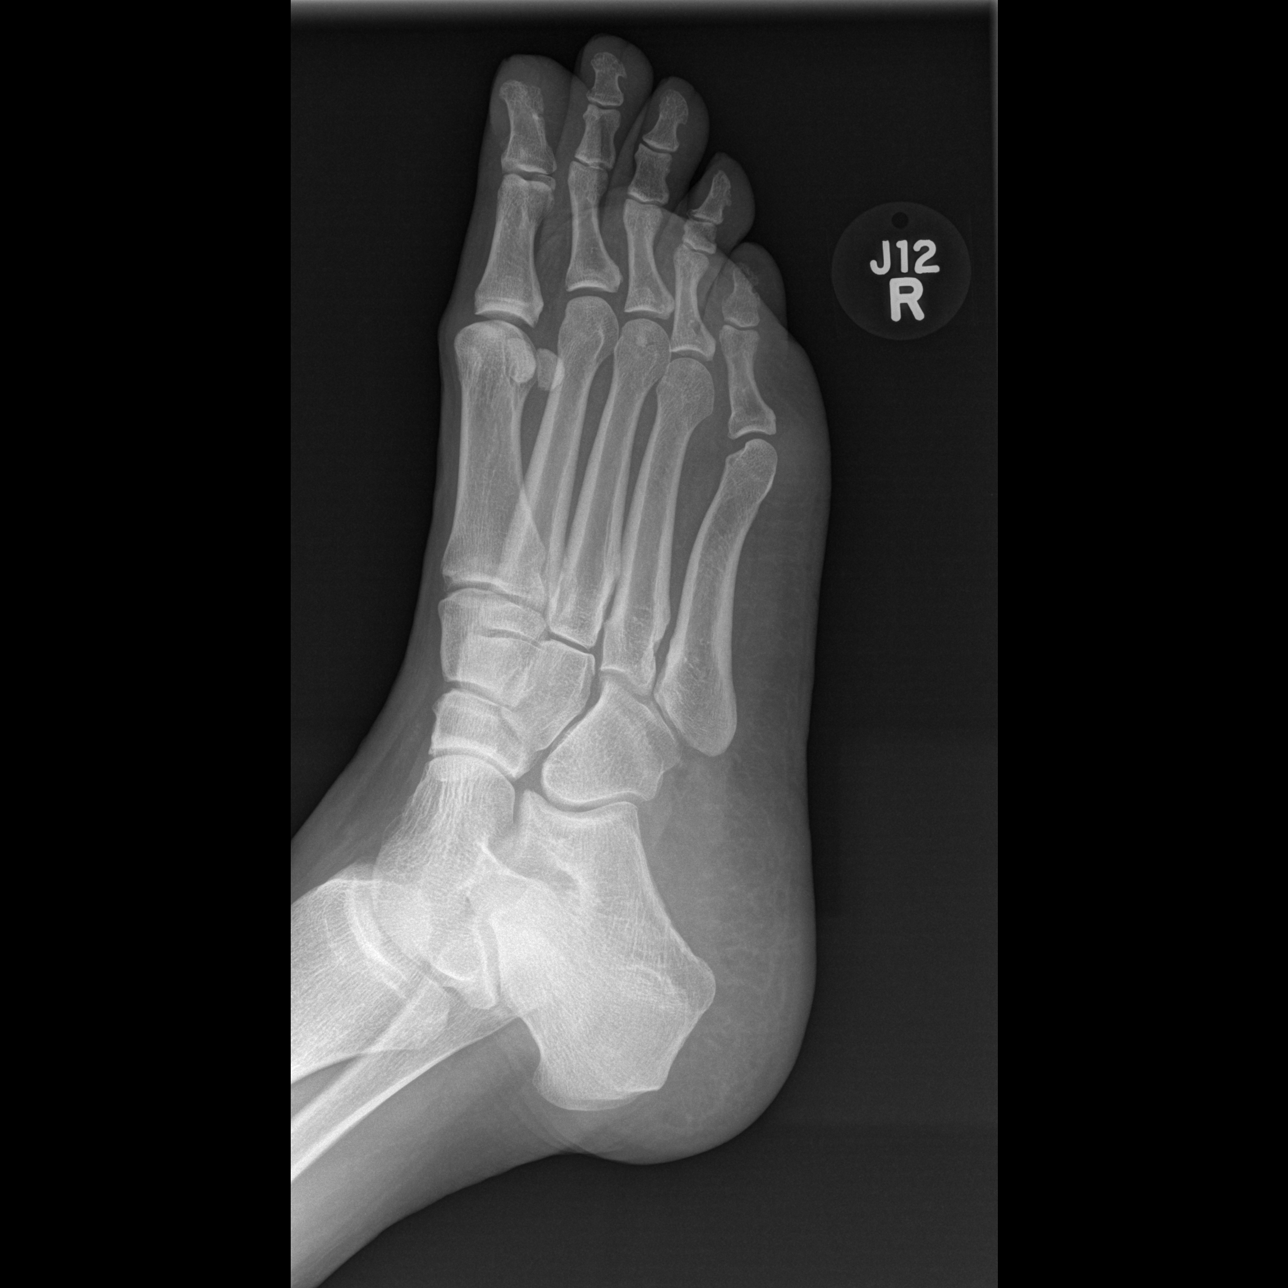

[t foot lat right]
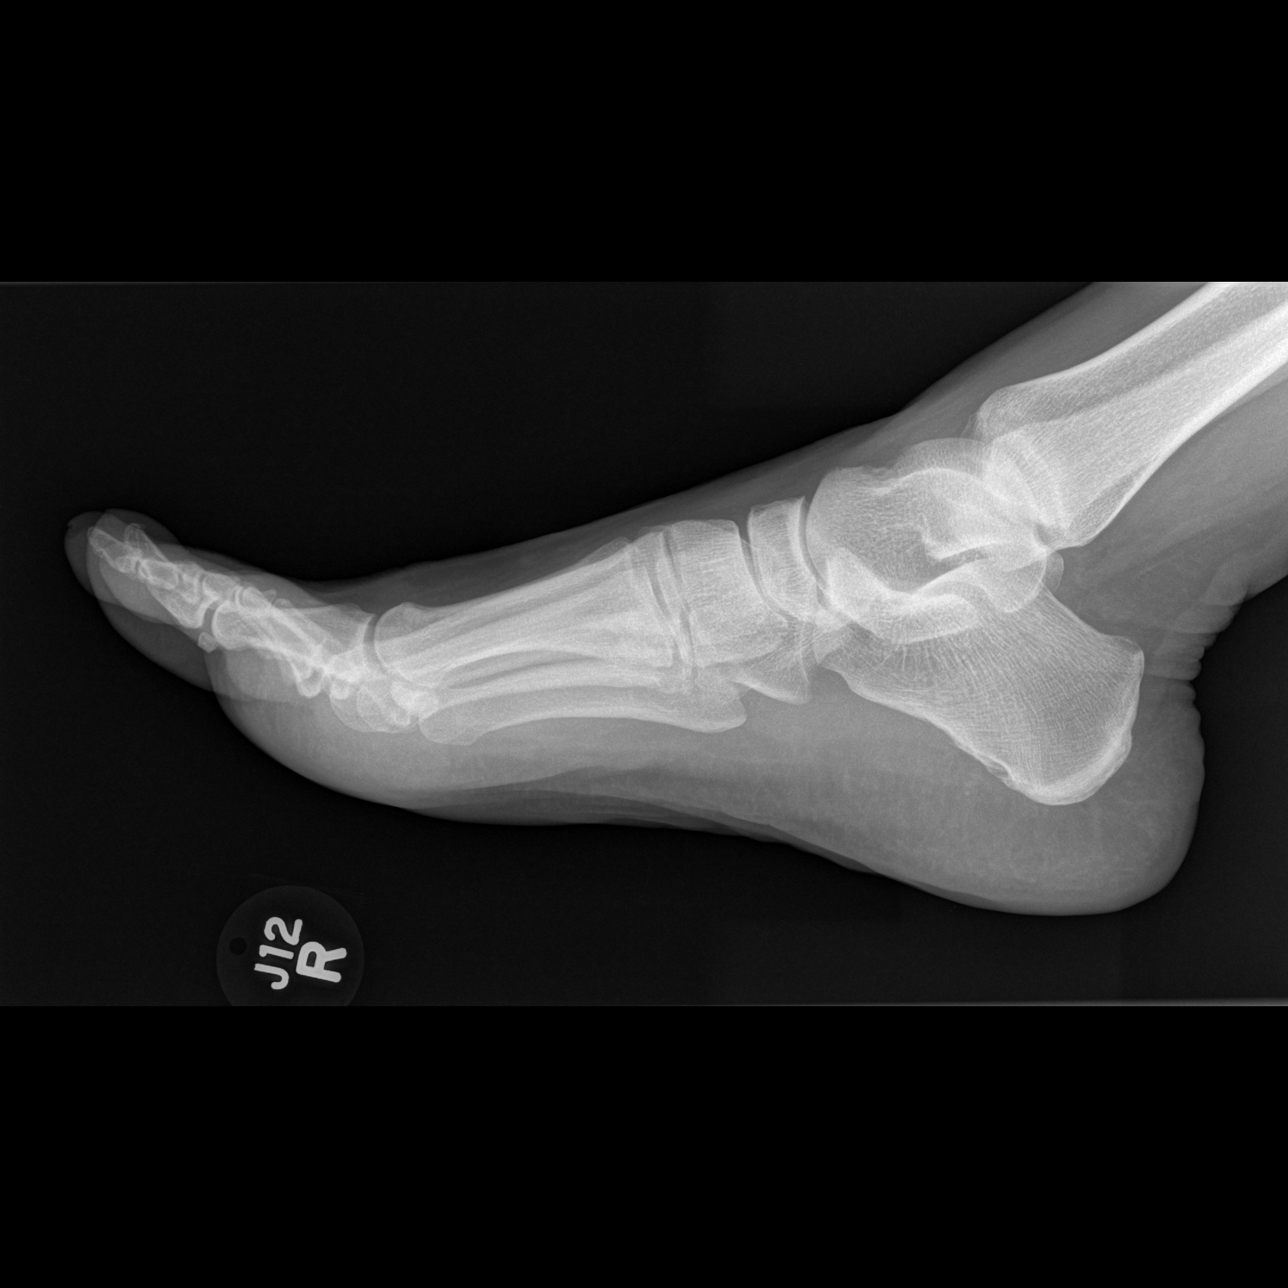

[3 of 3 positions shown; findings below may reference images not displayed]

FINDINGS: There is no evidence of fracture or dislocation. There is no
evidence of arthropathy or other focal bone abnormality. Soft
tissues are unremarkable.
IMPRESSION: Negative.

## 2016-02-07 ENCOUNTER — Telehealth: Payer: Self-pay | Admitting: Internal Medicine

## 2016-02-07 ENCOUNTER — Encounter: Payer: Self-pay | Admitting: Cardiovascular Disease

## 2016-02-07 ENCOUNTER — Ambulatory Visit (INDEPENDENT_AMBULATORY_CARE_PROVIDER_SITE_OTHER): Payer: 59 | Admitting: Cardiovascular Disease

## 2016-02-07 ENCOUNTER — Ambulatory Visit: Payer: 59 | Admitting: Cardiovascular Disease

## 2016-02-07 VITALS — BP 110/82 | HR 66 | Ht 65.0 in | Wt 218.0 lb

## 2016-02-07 DIAGNOSIS — I10 Essential (primary) hypertension: Secondary | ICD-10-CM

## 2016-02-07 DIAGNOSIS — Z0000 Encounter for general adult medical examination without abnormal findings: Secondary | ICD-10-CM

## 2016-02-07 DIAGNOSIS — I480 Paroxysmal atrial fibrillation: Secondary | ICD-10-CM | POA: Diagnosis not present

## 2016-02-07 DIAGNOSIS — E669 Obesity, unspecified: Secondary | ICD-10-CM | POA: Diagnosis not present

## 2016-02-07 DIAGNOSIS — E785 Hyperlipidemia, unspecified: Secondary | ICD-10-CM

## 2016-02-07 DIAGNOSIS — E039 Hypothyroidism, unspecified: Secondary | ICD-10-CM

## 2016-02-07 NOTE — Telephone Encounter (Signed)
Message sent to Pt via MyChart. CMP, FLP, CBC, Iron, Ferritin, and TSH ordered.

## 2016-02-07 NOTE — Patient Instructions (Signed)
Dr Elsberry recommends that you schedule a follow-up appointment in 6 months. You will receive a reminder letter in the mail two months in advance. If you don't receive a letter, please call our office to schedule the follow-up appointment.  If you need a refill on your cardiac medications before your next appointment, please call your pharmacy. 

## 2016-02-07 NOTE — Progress Notes (Signed)
-   Cardiology Office Note   Date:  02/07/2016   ID:  Connie Gentry, DOB [birth year], MRN [id number]  PCP:  Willow Ora, MD  Cardiologist:   Madilyn Hook, MD   Chief Complaint  Patient presents with  . Follow-up    no chest pain, no shortness of breath, no swelling, no cramping, no dizziness or lightheadedness    Patient ID: Connie Gentry is a 50 y.o. female with hypertension and Grave's disease who presents for follow up on atrial fibrillation.    Interval History 02/07/16: Connie Gentry has been doing extremely well since her last appointment.  Three weeks ago she had an episode of palpitations that occurred when driving at night.  The episode lasted for 30 seconds and was not associated with chest pain, shortness of breath, lightheadedness or dizziness.  She thinks it was due to anxiety and did not feel like atrial fibrillation.  Connie Gentry has been walking more and trying to exercise.  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  She exercises 30 minutes to an hour three times per week.  She denies any symptoms with exercise.  Connie Gentry has accepted a job a [employer].  She will be moving to [place] soon.  History of Present Illness 08/30/15:  Connie Gentry presented to the [facility] emergency department on August 10 with atrial fibrillation with RVR. Upon arrival her heart rate was in the 170s. She was initially attempted to be rate controlled with diltiazem but failed. She ultimately was pharmacologically cardioverted with one dose of flecainide. She spontaneously converted to sinus rhythm and has been in sinus rhythm ever since. She was discharged on metoprolol and Xarelto. This Kemler has been feeling well since discharge. She denies any further episodes of atrial fibrillation. She denies any chest pain, shortness of breath, orthopnea, PND, lightheadedness or dizziness. She had an echo that showed grade 1 diastolic dysfunction but was otherwise unremarkable. Her thyroid function and  electrolytes are also within normal limits.  [name] has been going through a lot of stress recently she is going through a divorce with her husband of many years. She also recently had to put down her dog. She's been trying to keep occupied by exercising. Prior to this episode she had joined a gym and enjoyed exercising most days of the week. She also had started the green smoothie cleanse. With these efforts she had lost 26 pounds since January. She wonders if it is okay to resume these activities.    Past Medical History  Diagnosis Date  . Shingles 11-2013  . Allergy   . Hypertension   . Hyperlipidemia   . Thyroid disease     history of Graves' disease, status radioactive iodine 2003.  Connie Gentry menopause     Past Surgical History  Procedure Laterality Date  . Abdominal hysterectomy  2010    partial  . Oophorectomy  2010 at time of hysterectomy    side? L? R?     Current Outpatient Prescriptions  Medication Sig Dispense Refill  . cetirizine (ZYRTEC) 10 MG tablet Take 10 mg by mouth daily.    . clobetasol ointment (TEMOVATE) 0.05 % Apply 1 application topically 2 (two) times daily. 30 g 0  . hydrochlorothiazide (HYDRODIURIL) 25 MG tablet Take 1 tablet (25 mg total) by mouth daily. 90 tablet 1  . levothyroxine (SYNTHROID, LEVOTHROID) 125 MCG tablet TAKE 1 TABLET BY MOUTH DAILY BEFORE BREAKFAST. 90 tablet 0  . metoprolol  tartrate (LOPRESSOR) 25 MG tablet Take 0.5 tablets (12.5 mg total) by mouth 2 (two) times daily. 30 tablet 6  . Multiple Vitamins-Minerals (MULTIVITAMIN ADULT PO) Take 1 tablet by mouth daily. OTC    . rivaroxaban (XARELTO) 20 MG TABS tablet Take 1 tablet (20 mg total) by mouth daily with supper. 30 tablet 6  . [DISCONTINUED] apixaban (ELIQUIS) 5 MG TABS tablet Take 1 tablet (5 mg total) by mouth 2 (two) times daily. 60 tablet 0   No current facility-administered medications for this visit.    Allergies:   Bee venom; Doxycycline; Cephalexin; and Vicodin     Social History:  The patient  reports that she has quit smoking. She has never used smokeless tobacco. She reports that she drinks alcohol. She reports that she does not use illicit drugs.   Family History:  The patient's family history includes Arthritis in her maternal grandfather, maternal grandmother, paternal grandfather, and paternal grandmother; Diabetes in her other; Heart disease in her maternal grandfather, maternal grandmother, paternal grandfather, and paternal grandmother; Hypertension in her father and mother. There is no history of Colon cancer or Breast cancer.    ROS:  Please see the history of present illness.   Otherwise, review of systems are positive for none.   All other systems are reviewed and negative.    PHYSICAL EXAM: VS:  BP 110/82 mmHg  Pulse 66  Ht 5\' 5"  (1.651 m)  Wt 98.884 kg (218 lb)  BMI 36.28 kg/m2 , BMI Body mass index is 36.28 kg/(m^2). GENERAL:  Well appearing HEENT:  Pupils equal round and reactive, fundi not visualized, oral mucosa unremarkable NECK:  No jugular venous distention, waveform within normal limits, carotid upstroke brisk and symmetric, no bruits, no thyromegaly LYMPHATICS:  No cervical adenopathy LUNGS:  Clear to auscultation bilaterally HEART:  RRR.  PMI not displaced or sustained,S1 and S2 within normal limits, no S3, no S4, no clicks, no rubs, no murmurs ABD:  Flat, positive bowel sounds normal in frequency in pitch, no bruits, no rebound, no guarding, no midline pulsatile mass, no hepatomegaly, no splenomegaly EXT:  2 plus pulses throughout, no edema, no cyanosis no clubbing SKIN:  No rashes no nodules NEURO:  Cranial nerves II through XII grossly intact, motor grossly intact throughout PSYCH:  Cognitively intact, oriented to person place and time    EKG:  EKG is ordered today. The ekg ordered today demonstrates sinus rhythm rate 66 bpm.   Recent Labs: 03/30/2015: ALT 24 08/08/2015: BUN 11; Creatinine, Ser 0.74; Hemoglobin  11.6*; Magnesium 1.9; Platelets 240; Potassium 3.6; Sodium 140; TSH 2.136    Lipid Panel    Component Value Date/Time   CHOL 227* 08/01/2013 1416   TRIG 83 08/01/2013 1416   HDL 56 08/01/2013 1416   CHOLHDL 4.1 08/01/2013 1416   VLDL 17 08/01/2013 1416   LDLCALC 154* 08/01/2013 1416   LDLDIRECT 137.2 06/09/2012 0959      Wt Readings from Last 3 Encounters:  02/07/16 98.884 kg (218 lb)  01/01/16 97.58 kg (215 lb 2 oz)  08/30/15 91.627 kg (202 lb)      Other studies Reviewed: Additional studies/ records that were reviewed today include: . Review of the above records demonstrates:  Please see elsewhere in the note.    ASSESSMENT AND PLAN:  # Atrial fibrillation: Ms. Krahl has not had any further episodes of atrial fibrillation.  No cause was identified for her atrial fibrillation, as she has normal thyroid function, does not drink heavily, and  has no significant valvular abnormalities. She has tolerated metoprolol and her Xarelto well. We will not make any changes to her regimen at this time. CBC and BMP pending with her PCP.  This patients CHA2DS2-VASc Score and unadjusted Ischemic Stroke Rate (% per year) is equal to 0.6 % stroke rate/year from a score of 1 Above score calculated as 1 point each if present [CHF, HTN, DM, Vascular=MI/PAD/Aortic Plaque, Age if 65-74, or Female] Above score calculated as 2 points each if present [Age > 75, or Stroke/TIA/TE]  # Obesity: Ms. Beumer is doing well with her exercise.  She was congratulated on her efforts and encouraged to make the improvements to her diet that she has planned.  # Hypertension: Ms. Sax blood pressure is well controlled today. We will not make any changes to her hydrochlorothiazide and metoprolol.  Current medicines are reviewed at length with the patient today.  The patient does not have concerns regarding medicines.  The following changes have been made:  no change  Labs/ tests ordered today include:   No  orders of the defined types were placed in this encounter.     Disposition:   FU with Dr. Elmarie Shiley C. [place] in 6 months.     Signed, Madilyn Hook, MD  02/07/2016 4:46 PM    [facility]

## 2016-02-07 NOTE — Telephone Encounter (Signed)
Was recommended to get labs at [facility] and fax results to me, I have not seen the results. Please arrange for labs at this office, dx CPX: CMP FLP CBC, iron, ferritin TSH

## 2016-02-12 ENCOUNTER — Other Ambulatory Visit: Payer: 59

## 2016-02-13 ENCOUNTER — Other Ambulatory Visit (INDEPENDENT_AMBULATORY_CARE_PROVIDER_SITE_OTHER): Payer: 59

## 2016-02-13 DIAGNOSIS — Z0000 Encounter for general adult medical examination without abnormal findings: Secondary | ICD-10-CM | POA: Diagnosis not present

## 2016-02-13 DIAGNOSIS — E785 Hyperlipidemia, unspecified: Secondary | ICD-10-CM | POA: Diagnosis not present

## 2016-02-13 DIAGNOSIS — I10 Essential (primary) hypertension: Secondary | ICD-10-CM | POA: Diagnosis not present

## 2016-02-13 DIAGNOSIS — E039 Hypothyroidism, unspecified: Secondary | ICD-10-CM | POA: Diagnosis not present

## 2016-02-13 LAB — CBC AND DIFFERENTIAL
HCT: 37 % (ref 36–46)
Hemoglobin: 11.9 g/dL — AB (ref 12.0–16.0)
PLATELETS: 249 10*3/uL (ref 150–399)
WBC: 4.7 10*3/mL

## 2016-02-13 LAB — HEPATIC FUNCTION PANEL
ALK PHOS: 86 U/L (ref 25–125)
ALT: 24 U/L (ref 7–35)
AST: 20 U/L (ref 13–35)
BILIRUBIN, TOTAL: 0.5 mg/dL

## 2016-02-13 LAB — BASIC METABOLIC PANEL
BUN: 14 mg/dL (ref 4–21)
CREATININE: 0.8 mg/dL (ref 0.5–1.1)
Glucose: 100 mg/dL
Potassium: 3.8 mmol/L (ref 3.4–5.3)
Sodium: 141 mmol/L (ref 137–147)

## 2016-02-13 LAB — LIPID PANEL
Cholesterol: 270 mg/dL — AB (ref 0–200)
HDL: 66 mg/dL (ref 35–70)
LDL CALC: 183 mg/dL
LDl/HDL Ratio: 4.1
TRIGLYCERIDES: 105 mg/dL (ref 40–160)

## 2016-02-13 LAB — TSH: TSH: 0.04 u[IU]/mL — AB (ref 0.41–5.90)

## 2016-02-22 MED FILL — XARELTO 20 MG TABLET: 20 | 30 days supply | Qty: 30 | Fill #6 | Status: TO

## 2016-02-22 MED FILL — METOPROLOL TARTRATE 25 MG T: 25 | 30 days supply | Qty: 30 | Fill #6 | Status: TO

## 2016-02-26 ENCOUNTER — Ambulatory Visit: Payer: 59 | Admitting: Cardiovascular Disease

## 2016-03-14 ENCOUNTER — Ambulatory Visit: Payer: 59 | Admitting: Internal Medicine

## 2016-03-26 ENCOUNTER — Telehealth: Payer: Self-pay | Admitting: *Deleted

## 2016-03-26 ENCOUNTER — Telehealth: Payer: Self-pay | Admitting: Internal Medicine

## 2016-03-26 MED ORDER — LEVOTHYROXINE SODIUM 100 MCG PO TABS: 100.0000 ug | ORAL_TABLET | Freq: Every day | ORAL | Status: AC

## 2016-03-26 MED ORDER — HYDROCHLOROTHIAZIDE 25 MG PO TABS: 25.0000 mg | ORAL_TABLET | Freq: Every day | ORAL | Status: AC

## 2016-03-26 MED ORDER — ATORVASTATIN CALCIUM 20 MG PO TABS: 20.0000 mg | ORAL_TABLET | Freq: Every day | ORAL | Status: DC

## 2016-03-26 MED ORDER — RIVAROXABAN 20 MG PO TABS: 20.0000 mg | ORAL_TABLET | Freq: Every day | ORAL | Status: DC

## 2016-03-26 MED ORDER — METOPROLOL TARTRATE 25 MG PO TABS: 12.5000 mg | ORAL_TABLET | Freq: Two times a day (BID) | ORAL | Status: DC

## 2016-03-26 NOTE — Telephone Encounter (Signed)
Received labs, placed in MD's red folder for review.

## 2016-03-26 NOTE — Telephone Encounter (Signed)
Received a fax with labs obtained 02/13/2016: Hemoglobin 11.9, platelets 249. Iron and ferritin normal Potassium 3.8, creatinine 0.7, glucose 100 LFTs normal Total cholesterol 270, LDL 183 TSH 0.04. Please call the patient: --Based on these results, needs to decrease levothyroxine to 100 mcg, and tablet daily #30 and one refills. --Also needs a cholesterol medication, recommend Lipitor 20 one by mouth daily, #30 and one refill Schedule labs in 6 weeks: TSH--- hypothyroidism FLP, AST, ALT--- DX hyperlipidemia

## 2016-03-26 NOTE — Telephone Encounter (Signed)
Patient requests a ninety day supply refill on metoprolol and xarelto called into [facility]. The number that she provided to call is [phone].

## 2016-03-26 NOTE — Telephone Encounter (Signed)
Pharmacy: [facility] - [place], [place] - [address]  Reason for call: Pt called for refills on levothyroxine. She is out. She is using new pharmacy and apologized for last minute notice. She needs 90 day supply of meds. Pt also needing refill on hydrochlorothiazide sent in. She has a few doses left. Please send to new pharmacy also for 90 day supply.

## 2016-03-26 NOTE — Telephone Encounter (Signed)
Notified pt of below - she said she will call the office that she did labs and have them send to us. She states it was in February.

## 2016-03-26 NOTE — Telephone Encounter (Signed)
Will be unable to refill medication, Pt has not had lab work for physical from 01/2016. She informed us she would have them elsewhere, and fax us labs. We have never received. She will either need to fax us those results or come to our lab.  See below.       Diagnoses     Essential hypertension - Primary    ICD-9-CM: 401.9 ICD-10-CM: I10    Hyperlipidemia     ICD-9-CM: 272.4 ICD-10-CM: E78.5    Annual physical exam     ICD-9-CM: V70.0 ICD-10-CM: Z00.00    Hypothyroidism, unspecified hypothyroidism type     ICD-9-CM: 244.9 ICD-10-CM: E03.9        Call Documentation      Conrad BurlingtonKaylyn Endia Moncur, CMA at 02/07/2016 1:03 PM     Status: Signed       Expand All Collapse All   Message sent to Pt via MyChart. CMP, FLP, CBC, Iron, Ferritin, and TSH ordered.             Wanda PlumpJose E Paz, MD at 02/07/2016 12:49 PM     Status: Signed       Expand All Collapse All   Was [facility] and fax results to me, I have not seen the results. Please arrange for labs at this office, dx CPX: CMP FLP CBC, iron, ferritin TSH

## 2016-03-26 NOTE — Telephone Encounter (Signed)
LMOM informing Pt of lab results, instructed to change Synthroid to 100 mcg daily, and start Lipitor 20 mg 1 tablet daily (Rx's sent), instructed she will need to have labs completed in 6 weeks (either at the lab she had originally and have them fax results, or at our lab). Instructed her to call if any questions or concerns.

## 2016-03-26 NOTE — Telephone Encounter (Signed)
Rx(s) sent to pharmacy electronically.  

## 2016-04-09 ENCOUNTER — Ambulatory Visit: Payer: Self-pay | Admitting: Internal Medicine

## 2016-04-23 ENCOUNTER — Ambulatory Visit: Payer: Self-pay | Admitting: Internal Medicine

## 2016-06-04 ENCOUNTER — Telehealth: Payer: Self-pay | Admitting: Internal Medicine

## 2016-06-04 DIAGNOSIS — E039 Hypothyroidism, unspecified: Secondary | ICD-10-CM

## 2016-06-04 DIAGNOSIS — E785 Hyperlipidemia, unspecified: Secondary | ICD-10-CM

## 2016-06-04 NOTE — Telephone Encounter (Signed)
Due for labs, please aschedule: TSH--- hypothyroidism FLP, AST, ALT--- DX hyperlipidemia

## 2016-06-05 NOTE — Telephone Encounter (Signed)
FLP, AST, ALT, TSH ordered. Letter printed and mailed to Pt.

## 2016-06-26 ENCOUNTER — Other Ambulatory Visit: Payer: Self-pay | Admitting: *Deleted

## 2016-06-26 MED ORDER — RIVAROXABAN 20 MG PO TABS: 20.0000 mg | ORAL_TABLET | Freq: Every day | ORAL | Status: DC

## 2016-07-19 ENCOUNTER — Encounter: Payer: Self-pay | Admitting: Cardiovascular Disease

## 2016-08-19 ENCOUNTER — Ambulatory Visit: Payer: Self-pay | Admitting: Cardiovascular Disease

## 2016-08-25 ENCOUNTER — Other Ambulatory Visit: Payer: Self-pay | Admitting: Pharmacist Clinician (PhC)/ Clinical Pharmacy Specialist

## 2016-08-25 MED ORDER — RIVAROXABAN 20 MG PO TABS: 20.0000 mg | ORAL_TABLET | Freq: Every day | ORAL | 1 refills | Status: DC

## 2016-09-03 ENCOUNTER — Ambulatory Visit: Payer: Self-pay | Admitting: Cardiovascular Disease

## 2016-09-03 NOTE — Progress Notes (Deleted)
-   Cardiology Office Note   Date:  09/03/2016   ID:  Connie Gentry, DOB 02/09/1966, MRN [id number]  PCP:  Willow Ora, MD  Cardiologist:   Chilton Si, MD   No chief complaint on file.   History of Present Illness: Connie Gentry is a 50 y.o. female with hypertension and Grave's disease who presents for follow up on atrial fibrillation.  Connie Gentry was first diagnosed with AF with RVR 07/2015 when she presented with rapid ventricular response.   Upon arrival her heart rate was in the 170s. She was initially attempted to be rate controlled with diltiazem but failed. She ultimately was pharmacologically cardioverted with one dose of flecainide. She spontaneously converted to sinus rhythm and has been in sinus rhythm ever since. She was discharged on metoprolol and Xarelto. She had an echo that showed grade 1 diastolic dysfunction but was otherwise unremarkable. Her thyroid function and electrolytes are also within normal limits.  Since her last appointment she was started on Synthroid for hypothyroidism.  She was also started on atorvastatin.    Connie Gentry has been going through a lot of stress recently she is going through a divorce with her husband of many years. She also recently had to put down her dog. She's been trying to keep occupied by exercising. Prior to this episode she had joined a gym and enjoyed exercising most days of the week. She also had started the green smoothie cleanse. With these efforts she had lost 26 pounds since January. She wonders if it is okay to resume these activities.  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  She exercises 30 minutes to an hour three times per week.  She denies any symptoms with exercise.   Connie Gentry has accepted a job a [employer].  She will be moving to [place] soon.   Past Medical History:  Diagnosis Date  . Allergy   . Early menopause   . Hyperlipidemia   . Hypertension   . Shingles 11-2013  . Thyroid disease    history  of Graves' disease, status radioactive iodine 2003.    Past Surgical History:  Procedure Laterality Date  . ABDOMINAL HYSTERECTOMY  2010   partial  . OOPHORECTOMY  2010 at time of hysterectomy   side? L? R?     Current Outpatient Prescriptions  Medication Sig Dispense Refill  . atorvastatin (LIPITOR) 20 MG tablet Take 1 tablet (20 mg total) by mouth daily. 90 tablet 0  . cetirizine (ZYRTEC) 10 MG tablet Take 10 mg by mouth daily.    . clobetasol ointment (TEMOVATE) 0.05 % Apply 1 application topically 2 (two) times daily. 30 g 0  . hydrochlorothiazide (HYDRODIURIL) 25 MG tablet Take 1 tablet (25 mg total) by mouth daily. 90 tablet 1  . levothyroxine (SYNTHROID, LEVOTHROID) 100 MCG tablet Take 1 tablet (100 mcg total) by mouth daily before breakfast. 90 tablet 0  . metoprolol tartrate (LOPRESSOR) 25 MG tablet Take 0.5 tablets (12.5 mg total) by mouth 2 (two) times daily. 90 tablet 0  . Multiple Vitamins-Minerals (MULTIVITAMIN ADULT PO) Take 1 tablet by mouth daily. OTC    . rivaroxaban (XARELTO) 20 MG TABS tablet Take 1 tablet (20 mg total) by mouth daily with supper. 90 tablet 1   No current facility-administered medications for this visit.     Allergies:   Bee venom; Doxycycline; Cephalexin; and Vicodin [hydrocodone-acetaminophen]    Social History:  The patient  reports that she has  quit smoking. She has never used smokeless tobacco. She reports that she drinks alcohol. She reports that she does not use drugs.   Family History:  The patient's family history includes Arthritis in her maternal grandfather, maternal grandmother, paternal grandfather, and paternal grandmother; Diabetes in her other; Heart disease in her maternal grandfather, maternal grandmother, paternal grandfather, and paternal grandmother; Hypertension in her father and mother.    ROS:  Please see the history of present illness.   Otherwise, review of systems are positive for none.   All other systems are  reviewed and negative.    PHYSICAL EXAM: VS:  There were no vitals taken for this visit. , BMI There is no height or weight on file to calculate BMI. GENERAL:  Well appearing HEENT:  Pupils equal round and reactive, fundi not visualized, oral mucosa unremarkable NECK:  No jugular venous distention, waveform within normal limits, carotid upstroke brisk and symmetric, no bruits, no thyromegaly LYMPHATICS:  No cervical adenopathy LUNGS:  Clear to auscultation bilaterally HEART:  RRR.  PMI not displaced or sustained,S1 and S2 within normal limits, no S3, no S4, no clicks, no rubs, no murmurs ABD:  Flat, positive bowel sounds normal in frequency in pitch, no bruits, no rebound, no guarding, no midline pulsatile mass, no hepatomegaly, no splenomegaly EXT:  2 plus pulses throughout, no edema, no cyanosis no clubbing SKIN:  No rashes no nodules NEURO:  Cranial nerves II through XII grossly intact, motor grossly intact throughout PSYCH:  Cognitively intact, oriented to person place and time    EKG:  EKG is ordered today. The ekg ordered today demonstrates sinus rhythm rate 66 bpm.   Recent Labs: 02/13/2016: ALT 24; BUN 14; Creatinine 0.8; Hemoglobin 11.9; Platelets 249; Potassium 3.8; Sodium 141; TSH 0.04    Lipid Panel    Component Value Date/Time   CHOL 270 (A) 02/13/2016   TRIG 105 02/13/2016   HDL 66 02/13/2016   CHOLHDL 4.1 08/01/2013 1416   VLDL 17 08/01/2013 1416   LDLCALC 183 02/13/2016   LDLDIRECT 137.2 06/09/2012 0959      Wt Readings from Last 3 Encounters:  02/07/16 218 lb (98.9 kg)  01/01/16 215 lb 2 oz (97.6 kg)  08/30/15 202 lb (91.6 kg)      Other studies Reviewed: Additional studies/ records that were reviewed today include: . Review of the above records demonstrates:  Please see elsewhere in the note.    ASSESSMENT AND PLAN:  # Atrial fibrillation: Ms. Connie Gentry has not had any further episodes of atrial fibrillation.  No cause was identified for her atrial  fibrillation, as she has normal thyroid function, does not drink heavily, and has no significant valvular abnormalities. She has tolerated metoprolol and her Xarelto well. We will not make any changes to her regimen at this time. CBC and BMP pending with her PCP.  This patients CHA2DS2-VASc Score and unadjusted Ischemic Stroke Rate (% per year) is equal to 0.6 % stroke rate/year from a score of 1 Above score calculated as 1 point each if present [CHF, HTN, DM, Vascular=MI/PAD/Aortic Plaque, Age if 65-74, or Female] Above score calculated as 2 points each if present [Age > 75, or Stroke/TIA/TE]  # Obesity: Ms. Connie Gentry is doing well with her exercise.  She was congratulated on her efforts and encouraged to make the improvements to her diet that she has planned.  # Hypertension: [name]'s blood pressure is well controlled today. We will not make any changes to her hydrochlorothiazide and metoprolol.  Current medicines are reviewed at length with the patient today.  The patient does not have concerns regarding medicines.  The following changes have been made:  no change  Labs/ tests ordered today include:   No orders of the defined types were placed in this encounter.    Disposition:   FU with Dr. Elmarie Shiley C. [place] in 6 months.     Signed, Chilton Si, MD  09/03/2016 6:05 AM    [facility]

## 2016-09-30 ENCOUNTER — Ambulatory Visit (INDEPENDENT_AMBULATORY_CARE_PROVIDER_SITE_OTHER): Payer: Managed Care, Other (non HMO) | Admitting: Cardiovascular Disease

## 2016-09-30 ENCOUNTER — Encounter: Payer: Self-pay | Admitting: Cardiovascular Disease

## 2016-09-30 VITALS — BP 123/80 | HR 54 | Ht 65.0 in | Wt 210.2 lb

## 2016-09-30 DIAGNOSIS — I480 Paroxysmal atrial fibrillation: Secondary | ICD-10-CM

## 2016-09-30 DIAGNOSIS — E6609 Other obesity due to excess calories: Secondary | ICD-10-CM

## 2016-09-30 DIAGNOSIS — I10 Essential (primary) hypertension: Secondary | ICD-10-CM | POA: Diagnosis not present

## 2016-09-30 DIAGNOSIS — E7800 Pure hypercholesterolemia, unspecified: Secondary | ICD-10-CM

## 2016-09-30 MED ORDER — METOPROLOL SUCCINATE ER 25 MG PO TB24: ORAL_TABLET | ORAL | 3 refills | Status: DC

## 2016-09-30 NOTE — Patient Instructions (Signed)
Medication Instructions:  FINISH YOUR CURRENT METOPROLOL RX WHEN YOU FINISH YOU WILL START METOPROLOL SUCC 25 MG 1/2 TABLET DAILY   Labwork: HAVE YOUR PRIMARY CARE DOCTOR FAX YOU LABS (LIPID PANEL) TO THE OFFICE AT [phone] AFTER COMPLETED   Testing/Procedures: NONE  Follow-Up: Your physician wants you to follow-up in: 6 MONTH OV You will receive a reminder letter in the mail two months in advance. If you don't receive a letter, please call our office to schedule the follow-up appointment.   If you need a refill on your cardiac medications before your next appointment, please call your pharmacy.

## 2016-09-30 NOTE — Progress Notes (Signed)
-   Cardiology Office Note   Date:  09/30/2016   ID:  Connie NeighborsCharlene D Gentry, DOB 21-Sep-1966, MRN [id number]  PCP:  Willow OraJose Paz, MD  Cardiologist:   Chilton Siiffany [place], MD   Chief Complaint  Patient presents with  . Follow-up    Pt states no Sx.     History of Present Illness: Connie Gentry is a 50 y.o. female with hypertension and Grave's disease who presents for follow up on atrial fibrillation.  Ms. Connie Gentry was first diagnosed with AF with RVR 07/2015 when she presented with rapid ventricular response.   Upon arrival her heart rate was in the 170s. She was initially attempted to be rate controlled with diltiazem but failed. She ultimately was pharmacologically cardioverted with one dose of flecainide. She spontaneously converted to sinus rhythm and has been in sinus rhythm ever since. She was discharged on metoprolol and Xarelto. She had an echo that showed grade 1 diastolic dysfunction but was otherwise unremarkable. Her thyroid function and electrolytes are also within normal limits.  Ms. Connie Gentry accepted a job a [employer] and moved to [place].  She is working as a Geographical information systems officernurse tech and doesn't enjoy the job very much.  It is very physically demanding.  She denies chest pain or shortness of breath with this activity but does feel very fatigued by the end of the day. She has not been exercising as much, which she attributes to changes from her move and work. However, she plans to start back doing this soon. She reports short episodes of palpitations but no sustained arrhythmias. She also denies lightheadedness, dizziness, lower extremity edema, orthopnea, or PND. She does sit her blood pressure has been well-controlled.  She is frequently bradycardic with heart rates as low as the upper 40s. However, she is asymptomatic with this. She was previously started on atorvastatin but stopped taking this, as she would prefer to proceed with natural options. She has been taking flaxseed, avocado, and apple cider  vinegar.  She is due to have her lipids checked with her primary care physician next week.   Past Medical History:  Diagnosis Date  . Allergy   . Early menopause   . Hyperlipidemia   . Hypertension   . Shingles 11-2013  . Thyroid disease    history of Graves' disease, status radioactive iodine 2003.    Past Surgical History:  Procedure Laterality Date  . ABDOMINAL HYSTERECTOMY  2010   partial  . OOPHORECTOMY  2010 at time of hysterectomy   side? L? R?     Current Outpatient Prescriptions  Medication Sig Dispense Refill  . cetirizine (ZYRTEC) 10 MG tablet Take 10 mg by mouth as needed.     . clobetasol ointment (TEMOVATE) 0.05 % Apply 1 application topically 2 (two) times daily. 30 g 0  . hydrochlorothiazide (HYDRODIURIL) 25 MG tablet Take 1 tablet (25 mg total) by mouth daily. 90 tablet 1  . levothyroxine (SYNTHROID, LEVOTHROID) 100 MCG tablet Take 1 tablet (100 mcg total) by mouth daily before breakfast. 90 tablet 0  . Multiple Vitamins-Minerals (MULTIVITAMIN ADULT PO) Take 1 tablet by mouth daily. OTC    . rivaroxaban (XARELTO) 20 MG TABS tablet Take 1 tablet (20 mg total) by mouth daily with supper. 90 tablet 1  . metoprolol succinate (TOPROL XL) 25 MG 24 hr tablet TAKE 1/2 TABLET BY MOUTH ONCE A DAY 45 tablet 3   No current facility-administered medications for this visit.     Allergies:   Bee venom;  Doxycycline; Cephalexin; and Vicodin [hydrocodone-acetaminophen]    Social History:  The patient  reports that she has quit smoking. She has never used smokeless tobacco. She reports that she drinks alcohol. She reports that she does not use drugs.   Family History:  The patient's family history includes Arthritis in her maternal grandfather, maternal grandmother, paternal grandfather, and paternal grandmother; Diabetes in her other; Heart disease in her maternal grandfather, maternal grandmother, paternal grandfather, and paternal grandmother; Hypertension in her father and  mother.    ROS:  Please see the history of present illness.   Otherwise, review of systems are positive for none.   All other systems are reviewed and negative.    PHYSICAL EXAM: VS:  BP 123/80   Pulse (!) 54   Ht 5\' 5"  (1.651 m)   Wt 210 lb 3.2 oz (95.3 kg)   BMI 34.98 kg/m  , BMI Body mass index is 34.98 kg/m. GENERAL:  Well appearing HEENT:  Pupils equal round and reactive, fundi not visualized, oral mucosa unremarkable NECK:  No jugular venous distention, waveform within normal limits, carotid upstroke brisk and symmetric, no bruits LYMPHATICS:  No cervical adenopathy LUNGS:  Clear to auscultation bilaterally HEART:  RRR.  PMI not displaced or sustained,S1 and S2 within normal limits, no S3, no S4, no clicks, no rubs, no murmurs ABD:  Flat, positive bowel sounds normal in frequency in pitch, no bruits, no rebound, no guarding, no midline pulsatile mass, no hepatomegaly, no splenomegaly EXT:  2 plus pulses throughout, no edema, no cyanosis no clubbing SKIN:  No rashes no nodules NEURO:  Cranial nerves II through XII grossly intact, motor grossly intact throughout PSYCH:  Cognitively intact, oriented to person place and time   EKG:  EKG is ordered today. The ekg ordered 09/30/16 demonstrates sinus bradycardia rate 54 bpm.  Non-specific T wave abnormalities.    Recent Labs: 02/13/2016: ALT 24; BUN 14; Creatinine 0.8; Hemoglobin 11.9; Platelets 249; Potassium 3.8; Sodium 141; TSH 0.04    Lipid Panel    Component Value Date/Time   CHOL 270 (A) 02/13/2016   TRIG 105 02/13/2016   HDL 66 02/13/2016   CHOLHDL 4.1 08/01/2013 1416   VLDL 17 08/01/2013 1416   LDLCALC 183 02/13/2016   LDLDIRECT 137.2 06/09/2012 0959      Wt Readings from Last 3 Encounters:  09/30/16 210 lb 3.2 oz (95.3 kg)  02/07/16 218 lb (98.9 kg)  01/01/16 215 lb 2 oz (97.6 kg)      Other studies Reviewed: Additional studies/ records that were reviewed today include: . Review of the above records  demonstrates:  Please see elsewhere in the note.    ASSESSMENT AND PLAN:  # Atrial fibrillation: Connie Gentry has not had any further episodes of atrial fibrillation.  No cause was identified for her atrial fibrillation, as she has normal thyroid function, does not drink heavily, and has no significant valvular abnormalities. She has tolerated metoprolol and her Xarelto well. We will reduce metoprolol to 12.5 mg of metoprolol succinate daily due to bradycardia. This patients CHA2DS2-VASc Score and unadjusted Ischemic Stroke Rate (% per year) is equal to 0.6 % stroke rate/year from a score of 1 Above score calculated as 1 point each if present [CHF, HTN, DM, Vascular=MI/PAD/Aortic Plaque, Age if 65-74, or Female] Above score calculated as 2 points each if present [Age > 75, or Stroke/TIA/TE]  # Obesity: Ms. Landeck was encouraged to get back into her exercise regimen. She was congratulated on continuing to  adhere to a healthy diet.  # Hypertension: Ms. Ipock blood pressure is well controlled today. Reduce metoprolol to 12.5 mg daily due to bradycardia. Continue hydrochlorothiazide.  # Hyperlipidemia: Ms. Stangelo stop atorvastatin due to concern of the long-term effects. She has been trying to manage this with her diet. She will have her lipids checked with her PCP next week and we will discuss after we get these results.  Current medicines are reviewed at length with the patient today.  The patient does not have concerns regarding medicines.  The following changes have been made:  no change  Labs/ tests ordered today include:   No orders of the defined types were placed in this encounter.    Disposition:   FU with Dr. Elmarie Shiley C. [place] in 6 months.     Signed, Chilton Si, MD  09/30/2016 5:38 PM    [facility]

## 2017-02-24 ENCOUNTER — Telehealth: Payer: Self-pay | Admitting: Pharmacist

## 2017-02-24 MED ORDER — RIVAROXABAN 20 MG PO TABS: 20.0000 mg | ORAL_TABLET | Freq: Every day | ORAL | 0 refills | Status: DC

## 2017-02-24 NOTE — Telephone Encounter (Signed)
Sent Rx for 30 days supply until copy of blood received.

## 2017-03-10 ENCOUNTER — Other Ambulatory Visit: Payer: Self-pay | Admitting: Pharmacist Clinician (PhC)/ Clinical Pharmacy Specialist

## 2017-03-10 MED ORDER — RIVAROXABAN 20 MG PO TABS: 20.0000 mg | ORAL_TABLET | Freq: Every day | ORAL | 0 refills | Status: DC

## 2017-04-13 ENCOUNTER — Encounter: Payer: Self-pay | Admitting: *Deleted

## 2017-04-13 ENCOUNTER — Ambulatory Visit (INDEPENDENT_AMBULATORY_CARE_PROVIDER_SITE_OTHER): Payer: 59 | Admitting: Cardiovascular Disease

## 2017-04-13 VITALS — BP 120/72 | HR 63 | Ht 65.0 in | Wt 216.0 lb

## 2017-04-13 DIAGNOSIS — E7800 Pure hypercholesterolemia, unspecified: Secondary | ICD-10-CM | POA: Diagnosis not present

## 2017-04-13 DIAGNOSIS — I480 Paroxysmal atrial fibrillation: Secondary | ICD-10-CM

## 2017-04-13 DIAGNOSIS — I10 Essential (primary) hypertension: Secondary | ICD-10-CM | POA: Diagnosis not present

## 2017-04-13 DIAGNOSIS — E669 Obesity, unspecified: Secondary | ICD-10-CM

## 2017-04-13 NOTE — Patient Instructions (Signed)

## 2017-04-13 NOTE — Progress Notes (Signed)
-   Cardiology Office Note   Date:  04/13/2017   ID:  Connie Gentry, DOB 05-Mar-1966, MRN [id number]  PCP:  Pcp Not In System  Cardiologist:   Chilton Si, MD   Chief Complaint  Patient presents with  . Follow-up    pt reports no complaints    History of Present Illness: Connie Gentry is a 51 y.o. female with paroxysmal atrial fibrillation, hypertension and Grave's disease who presents for follow up.  Connie Gentry was first diagnosed with AF with RVR 07/2015 when she presented with rapid ventricular response.   Upon arrival her heart rate was in the 170s. She was initially attempted to be rate controlled with diltiazem but failed. She ultimately was pharmacologically cardioverted with one dose of flecainide. She spontaneously converted to sinus rhythm and has been in sinus rhythm ever since. She was discharged on metoprolol and Xarelto. She had an echo that showed grade 1 diastolic dysfunction but was otherwise unremarkable. Her thyroid function and electrolytes are also within normal limits.  Connie Gentry accepted a job a [employer] and moved to [place].  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 She also recently got engaged. She is doing very well and denies any chest pain or shortness of breath. She does still very tired and sometimes after working 12 hour shift. No chest pain or shortness of breath with activity, just feels very fatigued. She denies lower extremity edema, orthopnea, or PND. She notes that she gained 6 pounds over the winter in hopes to start back exercising in water aerobics soon.  She has been checking her heart rate and it is always been regular. She had one episode of palpitations that this occurred in the setting of not sleeping and did not last very long.  She notes leg pain after long shifts at work.  She started wearing compression stockings, which has helped.  She had labs checked with her PCP and reports that all  was within normal limits.     Past Medical History:  Diagnosis Date  . Allergy   . Early menopause   . Hyperlipidemia   . Hypertension   . Shingles 11-2013  . Thyroid disease    history of Graves' disease, status radioactive iodine 2003.    Past Surgical History:  Procedure Laterality Date  . ABDOMINAL HYSTERECTOMY  2010   partial  . OOPHORECTOMY  2010 at time of hysterectomy   side? L? R?     Current Outpatient Prescriptions  Medication Sig Dispense Refill  . cetirizine (ZYRTEC) 10 MG tablet Take 10 mg by mouth as needed.     . clobetasol ointment (TEMOVATE) 0.05 % Apply 1 application topically 2 (two) times daily. 30 g 0  . hydrochlorothiazide (HYDRODIURIL) 25 MG tablet Take 1 tablet (25 mg total) by mouth daily. 90 tablet 1  . levothyroxine (SYNTHROID, LEVOTHROID) 100 MCG tablet Take 1 tablet (100 mcg total) by mouth daily before breakfast. 90 tablet 0  . metoprolol succinate (TOPROL XL) 25 MG 24 hr tablet TAKE 1/2 TABLET BY MOUTH ONCE A DAY 45 tablet 3  . Multiple Vitamins-Minerals (MULTIVITAMIN ADULT PO) Take 1 tablet by mouth daily. OTC    . rivaroxaban (XARELTO) 20 MG TABS tablet Take 1 tablet (20 mg total) by mouth daily with supper. 30 tablet 0   No current facility-administered medications for this visit.     Allergies:   Bee venom; Doxycycline; Cephalexin; and  Vicodin [hydrocodone-acetaminophen]    Social History:  The patient  reports that she has quit smoking. She has never used smokeless tobacco. She reports that she drinks alcohol. She reports that she does not use drugs.   Family History:  The patient's family history includes Arthritis in her maternal grandfather, maternal grandmother, paternal grandfather, and paternal grandmother; Diabetes in her other; Heart disease in her maternal grandfather, maternal grandmother, paternal grandfather, and paternal grandmother; Hypertension in her father and mother.    ROS:  Please see the history of present  illness.   Otherwise, review of systems are positive for none.   All other systems are reviewed and negative.    PHYSICAL EXAM: VS:  BP 120/72   Pulse 63   Ht  (1.651 m)   Wt 98 kg (216 lb)   BMI 35.94 kg/m  , BMI Body mass index is 35.94 kg/m. GENERAL:  Well appearing HEENT:  Pupils equal round and reactive, fundi not visualized, oral mucosa unremarkable.  Poor dentition NECK:  No jugular venous distention, waveform within normal limits, carotid upstroke brisk and symmetric, no bruits LYMPHATICS:  No cervical adenopathy LUNGS:  Clear to auscultation bilaterally HEART:  RRR.  PMI not displaced or sustained,S1 and S2 within normal limits, no S3, no S4, no clicks, no rubs, no murmurs ABD:  Flat, positive bowel sounds normal in frequency in pitch, no bruits, no rebound, no guarding, no midline pulsatile mass, no hepatomegaly, no splenomegaly EXT:  2 plus pulses throughout, no edema, no cyanosis no clubbing SKIN:  No rashes no nodules NEURO:  Cranial nerves II through XII grossly intact, motor grossly intact throughout PSYCH:  Cognitively intact, oriented to person place and time   EKG:  EKG is ordered today. The ekg ordered 09/30/16 demonstrates sinus bradycardia rate 54 bpm.  Non-specific T wave abnormalities.  04/13/17: Sinus rhythm. Rate 63 bpm. Nonspecific T wave abnormalities   Recent Labs: No results found for requested labs within last 8760 hours.    Lipid Panel    Component Value Date/Time   CHOL 270 (A) 02/13/2016   TRIG 105 02/13/2016   HDL 66 02/13/2016   CHOLHDL 4.1 08/01/2013 1416   VLDL 17 08/01/2013 1416   LDLCALC 183 02/13/2016   LDLDIRECT 137.2 06/09/2012 0959      Wt Readings from Last 3 Encounters:  04/13/17 98 kg (216 lb)  09/30/16 95.3 kg (210 lb 3.2 oz)  02/07/16 98.9 kg (218 lb)      Other studies Reviewed: Additional studies/ records that were reviewed today include: . Review of the above records demonstrates:  Please see elsewhere in the  note.    ASSESSMENT AND PLAN:  # Atrial fibrillation: Connie Gentry has not had any further episodes of atrial fibrillation.  At the time her atrial fibrillation was diagnosed she was euthyroid.  However she subsequently developed hypothyroidism and is on levothyroxine. Continue metoprolol and Xarelto.  Ok to hold Xarelto for 3 days prior to dental procedure. This patients CHA2DS2-VASc Score and unadjusted Ischemic Stroke Rate (% per year) is equal to 0.6 % stroke rate/year from a score of 1 Above score calculated as 1 point each if present [CHF, HTN, DM, Vascular=MI/PAD/Aortic Plaque, Age if 65-74, or Female] Above score calculated as 2 points each if present [Age > 75, or Stroke/TIA/TE]  # Obesity: Ms. Foulkes was encouraged to get back into her exercise regimen.  # Hypertension: Ms. Baiza blood pressure is well controlled today. Continue metoprolol and HCTZ.  #  Hyperlipidemia: Ms. Saefong stop atorvastatin due to concern of the long-term effects. She has been trying to manage this with her diet.  We will get a copy of her most recent lipids.   Current medicines are reviewed at length with the patient today.  The patient does not have concerns regarding medicines.  The following changes have been made:  no change  Labs/ tests ordered today include:   No orders of the defined types were placed in this encounter.    Disposition:   FU with Dr. Elmarie Shiley C. [facility] Salvia in 1 year   Signed, Chilton Si, MD  04/13/2017 1:52 PM    [facility]

## 2017-04-13 NOTE — Addendum Note (Signed)
Addended by: Regis Bill B on: 04/13/2017 03:20 PM   Modules accepted: Orders

## 2017-04-23 ENCOUNTER — Other Ambulatory Visit: Payer: Self-pay | Admitting: Cardiovascular Disease

## 2017-04-23 MED ORDER — RIVAROXABAN 20 MG PO TABS: 20.0000 mg | ORAL_TABLET | Freq: Every day | ORAL | 3 refills | Status: DC

## 2017-04-23 NOTE — Telephone Encounter (Signed)
Rx has been sent to the pharmacy electronically. ° °

## 2017-04-23 NOTE — Telephone Encounter (Signed)
°*  STAT* If patient is at the pharmacy, call can be transferred to refill team.   1. Which medications need to be refilled? (please list name of each medication and dose if known) Xarelto-need this asap please  2. Which pharmacy/location (including street and city if local pharmacy) is medication to be sent to?[facility] [phone]  3. Do they need a 30 day or 90 day supply? 90 and refills

## 2017-05-21 ENCOUNTER — Encounter: Payer: Self-pay | Admitting: Cardiovascular Disease

## 2017-12-04 ENCOUNTER — Telehealth: Payer: Self-pay

## 2017-12-04 NOTE — Telephone Encounter (Signed)
   [facility] Pre-operative Risk Assessment    Request for surgical clearance:  1. What type of surgery is being performed? PERIODONTAL SCALING AND ROOT PLANNING AND TEETH EXTRACTION.   2. When is this surgery scheduled? NOT LISTED   3. Are there any medications that need to be held prior to surgery and how long? XARELTO   4. Practice name and name of physician performing surgery? [facility]  5. What is your office phone and fax number? [phone] FX [phone]   6. Anesthesia type (None, local, MAC, general) ? NONE LISTED   Connie Gentry 12/04/2017, 11:28 AM  _________________________________________________________________   (provider comments below)

## 2017-12-05 NOTE — Telephone Encounter (Signed)
   Chart reviewed as part of pre-operative protocol coverage. Given that tooth extraction is low risk procedure, based on ACC/AHA guidelines, Connie Gentry would be at acceptable risk for the planned procedure without further cardiovascular testing.   Per Dr. Leonides Sakeandolph's previous recommendation, Ok to hold Xarelto for 3 days prior to dental procedure.  I will route this recommendation to the requesting party via Epic fax function and remove from pre-op pool.  Please call with questions.  Connie Montanaayna N Dunn, PA-C 12/05/2017, 3:55 PM

## 2017-12-10 ENCOUNTER — Telehealth: Payer: Self-pay | Admitting: Cardiovascular Disease

## 2017-12-10 NOTE — Telephone Encounter (Signed)
Spoke with Connie Gentry and patient going to be having extensive dental work including about 8 extractions. Connie Gentry wanted to know how long patient needed to be off Xarelto prior and if premeds needed. Advised according to the [facility] guideline premedication not needed from cardiac standpoint. Will forward to Pharm D regarding Xarelto

## 2017-12-10 NOTE — Telephone Encounter (Signed)
Duplicate request.  [facility] and the never received fax from 12/04/2017.  Will fax hard copy to [phone]

## 2017-12-10 NOTE — Telephone Encounter (Signed)
Please call Connie Gentry, she have questions about her Medicine.She says she needs extensive dental work

## 2017-12-14 ENCOUNTER — Encounter: Payer: Self-pay | Admitting: Cardiovascular Disease

## 2017-12-15 ENCOUNTER — Other Ambulatory Visit: Payer: Self-pay

## 2017-12-15 DIAGNOSIS — I480 Paroxysmal atrial fibrillation: Secondary | ICD-10-CM

## 2017-12-15 MED ORDER — METOPROLOL SUCCINATE ER 25 MG PO TB24: ORAL_TABLET | ORAL | 0 refills | Status: DC

## 2017-12-15 NOTE — Telephone Encounter (Signed)
-----   Message from Mychart, Generic sent at 12/14/2017 6:33 PM EST ----- Good day. I need a refill on the Metoprolol and there are no more refills. I was unable to request it through MyChart. Would you please call [facility] to [facility] ? [phone]. Thanks in advance.  Rx refill sent as requested, pt notified via Mychart

## 2018-01-25 ENCOUNTER — Telehealth: Payer: Self-pay | Admitting: Cardiovascular Disease

## 2018-01-25 NOTE — Telephone Encounter (Signed)
Connie Gentry is calling because she was told to go off of her Xarelto  For 3 days for a dental procedure , but the dentist cannot see her until tomorrow at 8 am and she is wanting to know is it okay to be off of the Xarelto for one more day and when can she restart the medication after the dental procedure . Please call

## 2018-01-25 NOTE — Telephone Encounter (Signed)
Spoke with pt, aware she will have a greater risk of stroke being off the xarelto for longer time but to be able to have the dental procedure tomorrow she will need to remain off. Advised we want her to restart the xarelto as soon as the dentist will allow. Pt agreed with this plan.

## 2018-03-15 ENCOUNTER — Other Ambulatory Visit: Payer: Self-pay | Admitting: Cardiovascular Disease

## 2018-03-15 DIAGNOSIS — I480 Paroxysmal atrial fibrillation: Secondary | ICD-10-CM

## 2018-03-15 NOTE — Telephone Encounter (Signed)
Refill request

## 2018-04-12 ENCOUNTER — Telehealth: Payer: Self-pay | Admitting: Cardiovascular Disease

## 2018-04-12 NOTE — Telephone Encounter (Signed)
Returned call to patient of Dr. Duke Salviaandolph. She c/o palpitations off and on for the past week. She has PAF. She has not had any AF episodes in a while. She is noticing her palpitations more frequently. Her BP is running low 100s/50s-60s. Her HR is running 60s-80s. She denies chest pain, shortness of breath. She feels a little tired.   Offered her a PAOV for tomorrow but she declined as she lives in [place]. She would like to see if her PCP can do an EKG. Provided our fax # in case she wants to have EKG sent for MD review. Advised her to call back if schedule an appointment if she cannot be evaluated by her PCP

## 2018-04-12 NOTE — Telephone Encounter (Signed)
Patient c/o Palpitations:  High priority if patient c/o lightheadedness, shortness of breath, or chest pain  1) How long have you had palpitations/irregular HR/ Afib? Are you having the symptoms now? On and off for the last week, tired   2) Are you currently experiencing lightheadedness, SOB or CP? No    Do you have a history of afib (atrial fibrillation) or irregular heart rhythm? yes Have you checked your BP or HR? (document readings if available):  no 3) Are you experiencing any other symptoms? Fatigue

## 2018-04-13 NOTE — Telephone Encounter (Signed)
OK thank you 

## 2018-04-16 ENCOUNTER — Other Ambulatory Visit: Payer: Self-pay | Admitting: Cardiovascular Disease

## 2018-04-16 NOTE — Telephone Encounter (Signed)
New message     *STAT* If patient is at the pharmacy, call can be transferred to refill team.   1. Which medications need to be refilled? (please list name of each medication and dose if known) rivaroxaban (XARELTO) 20 MG TABS tablet  2. Which pharmacy/location (including street and city if local pharmacy) is medication to be sent to?[facility] [place],  - [address]  3. Do they need a 30 day or 90 day supply? 90

## 2018-04-18 MED ORDER — RIVAROXABAN 20 MG PO TABS: 20.0000 mg | ORAL_TABLET | Freq: Every day | ORAL | 0 refills | Status: AC

## 2018-04-22 ENCOUNTER — Telehealth: Payer: Self-pay | Admitting: Cardiovascular Disease

## 2018-04-22 NOTE — Telephone Encounter (Signed)
Called patient and LVM to call back if interested in an appointment on Monday 04-26-18.

## 2018-06-15 ENCOUNTER — Encounter

## 2018-06-15 ENCOUNTER — Ambulatory Visit: Payer: Self-pay | Admitting: Cardiovascular Disease

## 2018-06-15 ENCOUNTER — Encounter: Payer: Self-pay | Admitting: Cardiovascular Disease

## 2018-06-15 DIAGNOSIS — R0989 Other specified symptoms and signs involving the circulatory and respiratory systems: Secondary | ICD-10-CM

## 2018-06-15 NOTE — Progress Notes (Deleted)
-   Cardiology Office Note   Date:  06/15/2018   ID:  Connie Gentry, DOB 07-Jun-1966, MRN [id number]  PCP:  System, Pcp Not In  Cardiologist:   Chilton Si, MD   No chief complaint on file.   History of Present Illness: Connie Gentry is a 52 y.o. female with paroxysmal atrial fibrillation, hypertension and Grave's disease who presents for follow up.  Connie Gentry was first diagnosed with AF with RVR 07/2015.   Upon arrival her heart rate was in the 170s. She failed rate controlled with diltiazem.  She ultimately was pharmacologically cardioverted with one dose of flecainide.  She was discharged on metoprolol and Xarelto. She had an echo that showed grade 1 diastolic dysfunction but was otherwise unremarkable. Her thyroid function and electrolytes are also within normal limits.  Connie Gentry accepted a job a [employer] and moved to [place].  At her last appointment she was doing well.  Since that time she has noted increased palpitations.    Past Medical History:  Diagnosis Date  . Allergy   . Early menopause   . Hyperlipidemia   . Hypertension   . Shingles 11-2013  . Thyroid disease    history of Graves' disease, status radioactive iodine 2003.    Past Surgical History:  Procedure Laterality Date  . ABDOMINAL HYSTERECTOMY  2010   partial  . OOPHORECTOMY  2010 at time of hysterectomy   side? L? R?     Current Outpatient Medications  Medication Sig Dispense Refill  . cetirizine (ZYRTEC) 10 MG tablet Take 10 mg by mouth as needed.     . clobetasol ointment (TEMOVATE) 0.05 % Apply 1 application topically 2 (two) times daily. 30 g 0  . hydrochlorothiazide (HYDRODIURIL) 25 MG tablet Take 1 tablet (25 mg total) by mouth daily. 90 tablet 1  . levothyroxine (SYNTHROID, LEVOTHROID) 100 MCG tablet Take 1 tablet (100 mcg total) by mouth daily before breakfast. 90 tablet 0  . metoprolol succinate (TOPROL-XL) 25 MG 24 hr tablet TAKE 1/2 TABLET BY MOUTH ONCE A DAY 45 tablet 0  .  Multiple Vitamins-Minerals (MULTIVITAMIN ADULT PO) Take 1 tablet by mouth daily. OTC    . rivaroxaban (XARELTO) 20 MG TABS tablet Take 1 tablet (20 mg total) by mouth daily with supper. Until f/u appointment with cardiologist. 60 tablet 0   No current facility-administered medications for this visit.     Allergies:   Bee venom; Doxycycline; Cephalexin; and Vicodin [hydrocodone-acetaminophen]    Social History:  The patient  reports that she has quit smoking. She has never used smokeless tobacco. She reports that she drinks alcohol. She reports that she does not use drugs.   Family History:  The patient's family history includes Arthritis in her maternal grandfather, maternal grandmother, paternal grandfather, and paternal grandmother; Diabetes in her other; Heart disease in her maternal grandfather, maternal grandmother, paternal grandfather, and paternal grandmother; Hypertension in her father and mother.    ROS:  Please see the history of present illness.   Otherwise, review of systems are positive for none.   All other systems are reviewed and negative.    PHYSICAL EXAM: VS:  There were no vitals taken for this visit. , BMI There is no height or weight on file to calculate BMI. GENERAL:  Well appearing HEENT:  Pupils equal round and reactive, fundi not visualized, oral mucosa unremarkable.  Poor dentition NECK:  No jugular venous distention, waveform within normal limits, carotid upstroke brisk and symmetric,  no bruits LYMPHATICS:  No cervical adenopathy LUNGS:  Clear to auscultation bilaterally HEART:  RRR.  PMI not displaced or sustained,S1 and S2 within normal limits, no S3, no S4, no clicks, no rubs, no murmurs ABD:  Flat, positive bowel sounds normal in frequency in pitch, no bruits, no rebound, no guarding, no midline pulsatile mass, no hepatomegaly, no splenomegaly EXT:  2 plus pulses throughout, no edema, no cyanosis no clubbing SKIN:  No rashes no nodules NEURO:  Cranial  nerves II through XII grossly intact, motor grossly intact throughout PSYCH:  Cognitively intact, oriented to person place and time   EKG:  EKG is ordered today. The ekg ordered 09/30/16 demonstrates sinus bradycardia rate 54 bpm.  Non-specific T wave abnormalities.  04/13/17: Sinus rhythm. Rate 63 bpm. Nonspecific T wave abnormalities   Recent Labs: No results found for requested labs within last 8760 hours.    Lipid Panel    Component Value Date/Time   CHOL 270 (A) 02/13/2016   TRIG 105 02/13/2016   HDL 66 02/13/2016   CHOLHDL 4.1 08/01/2013 1416   VLDL 17 08/01/2013 1416   LDLCALC 183 02/13/2016   LDLDIRECT 137.2 06/09/2012 0959      Wt Readings from Last 3 Encounters:  04/13/17 216 lb (98 kg)  09/30/16 210 lb 3.2 oz (95.3 kg)  02/07/16 218 lb (98.9 kg)      Other studies Reviewed: Additional studies/ records that were reviewed today include: . Review of the above records demonstrates:  Please see elsewhere in the note.    ASSESSMENT AND PLAN:  # Atrial fibrillation: Connie Gentry has not had any further episodes of atrial fibrillation.  At the time her atrial fibrillation was diagnosed she was euthyroid.  However she subsequently developed hypothyroidism and is on levothyroxine. Continue metoprolol and Xarelto.  Ok to hold Xarelto for 3 days prior to dental procedure. This patients CHA2DS2-VASc Score and unadjusted Ischemic Stroke Rate (% per year) is equal to 0.6 % stroke rate/year from a score of 1 Above score calculated as 1 point each if present [CHF, HTN, DM, Vascular=MI/PAD/Aortic Plaque, Age if 65-74, or Female] Above score calculated as 2 points each if present [Age > 75, or Stroke/TIA/TE]  # Obesity: Connie Gentry was encouraged to get back into her exercise regimen.  # Hypertension: Connie Gentry's blood pressure is well controlled today. Continue metoprolol and HCTZ.  # Hyperlipidemia: Connie Gentry stop atorvastatin due to concern of the long-term effects. She has  been trying to manage this with her diet.  We will get a copy of her most recent lipids.   Current medicines are reviewed at length with the patient today.  The patient does not have concerns regarding medicines.  The following changes have been made:  no change  Labs/ tests ordered today include:   No orders of the defined types were placed in this encounter.    Disposition:   FU with Dr. Elmarie Shileyiffany C. Duke Salviaandolph in 1 year   Signed, Chilton Siiffany [place], MD  06/15/2018 7:59 AM    [facility]
